# Patient Record
Sex: Male | Born: 1975 | Race: White | Hispanic: No | Marital: Married | Smoking: Former smoker
Health system: Southern US, Community
[De-identification: ages and names within clinical notes are randomized; demographics above are authoritative.]

## PROBLEM LIST (undated history)

## (undated) DIAGNOSIS — J45909 Unspecified asthma, uncomplicated: Secondary | ICD-10-CM

## (undated) DIAGNOSIS — B029 Zoster without complications: Secondary | ICD-10-CM

## (undated) DIAGNOSIS — I8390 Asymptomatic varicose veins of unspecified lower extremity: Secondary | ICD-10-CM

## (undated) DIAGNOSIS — Z9109 Other allergy status, other than to drugs and biological substances: Secondary | ICD-10-CM

## (undated) DIAGNOSIS — F988 Other specified behavioral and emotional disorders with onset usually occurring in childhood and adolescence: Secondary | ICD-10-CM

## (undated) DIAGNOSIS — E785 Hyperlipidemia, unspecified: Secondary | ICD-10-CM

## (undated) DIAGNOSIS — Z87891 Personal history of nicotine dependence: Secondary | ICD-10-CM

## (undated) HISTORY — DX: Unspecified asthma, uncomplicated: J45.909

## (undated) HISTORY — DX: Zoster without complications: B02.9

## (undated) HISTORY — PX: VARICOSE VEIN SURGERY: SHX832

## (undated) HISTORY — DX: Other specified behavioral and emotional disorders with onset usually occurring in childhood and adolescence: F98.8

## (undated) HISTORY — DX: Asymptomatic varicose veins of unspecified lower extremity: I83.90

## (undated) HISTORY — DX: Personal history of nicotine dependence: Z87.891

## (undated) HISTORY — PX: ESOPHAGOGASTRODUODENOSCOPY: SHX1529

## (undated) HISTORY — DX: Hyperlipidemia, unspecified: E78.5

## (undated) HISTORY — DX: Other allergy status, other than to drugs and biological substances: Z91.09

---

## 2006-01-04 ENCOUNTER — Ambulatory Visit: Payer: Self-pay | Admitting: Family Medicine

## 2006-02-01 ENCOUNTER — Encounter: Admission: RE | Admit: 2006-02-01 | Discharge: 2006-02-01 | Payer: Self-pay | Admitting: Family Medicine

## 2006-02-06 ENCOUNTER — Ambulatory Visit: Payer: Self-pay | Admitting: Family Medicine

## 2007-05-15 ENCOUNTER — Encounter: Payer: Self-pay | Admitting: Family Medicine

## 2007-05-15 DIAGNOSIS — J4540 Moderate persistent asthma, uncomplicated: Secondary | ICD-10-CM

## 2007-05-15 DIAGNOSIS — J309 Allergic rhinitis, unspecified: Secondary | ICD-10-CM | POA: Insufficient documentation

## 2007-05-16 ENCOUNTER — Ambulatory Visit: Payer: Self-pay | Admitting: Family Medicine

## 2007-10-26 ENCOUNTER — Ambulatory Visit: Payer: Self-pay | Admitting: Family Medicine

## 2008-01-01 ENCOUNTER — Ambulatory Visit: Payer: Self-pay | Admitting: Family Medicine

## 2008-01-02 ENCOUNTER — Ambulatory Visit: Payer: Self-pay | Admitting: Family Medicine

## 2008-01-04 LAB — CONVERTED CEMR LAB
ALT: 41 units/L (ref 0–53)
AST: 26 units/L (ref 0–37)
Bilirubin, Direct: 0.1 mg/dL (ref 0.0–0.3)
CO2: 29 meq/L (ref 19–32)
Chloride: 103 meq/L (ref 96–112)
Cholesterol: 233 mg/dL (ref 0–200)
Creatinine, Ser: 1.2 mg/dL (ref 0.4–1.5)
Direct LDL: 147.5 mg/dL
GFR calc non Af Amer: 75 mL/min
Potassium: 4 meq/L (ref 3.5–5.1)
Sodium: 138 meq/L (ref 135–145)
Total Bilirubin: 0.9 mg/dL (ref 0.3–1.2)
Total CHOL/HDL Ratio: 6.7
VLDL: 42 mg/dL — ABNORMAL HIGH (ref 0–40)

## 2008-04-01 ENCOUNTER — Ambulatory Visit: Payer: Self-pay | Admitting: Family Medicine

## 2008-04-01 DIAGNOSIS — L989 Disorder of the skin and subcutaneous tissue, unspecified: Secondary | ICD-10-CM | POA: Insufficient documentation

## 2008-04-04 ENCOUNTER — Ambulatory Visit: Payer: Self-pay | Admitting: Family Medicine

## 2008-04-11 LAB — CONVERTED CEMR LAB
Cholesterol: 257 mg/dL (ref 0–200)
HDL: 34.6 mg/dL — ABNORMAL LOW (ref >39.0)
Total CHOL/HDL Ratio: 7.4
VLDL: 45 mg/dL — ABNORMAL HIGH (ref 0–40)

## 2008-05-05 ENCOUNTER — Encounter: Payer: Self-pay | Admitting: Family Medicine

## 2008-05-06 ENCOUNTER — Ambulatory Visit: Payer: Self-pay | Admitting: Family Medicine

## 2008-05-12 LAB — CONVERTED CEMR LAB
BUN: 17 mg/dL (ref 6–23)
Basophils Absolute: 0.2 10*3/uL — ABNORMAL HIGH (ref 0.0–0.1)
Calcium: 9.3 mg/dL (ref 8.4–10.5)
Eosinophils Relative: 9.9 % — ABNORMAL HIGH (ref 0.0–5.0)
GFR calc non Af Amer: 74.8 mL/min (ref >60)
Glucose, Bld: 84 mg/dL (ref 70–99)
Lymphocytes Relative: 34.7 % (ref 12.0–46.0)
Monocytes Relative: 10.8 % (ref 3.0–12.0)
Platelets: 242 10*3/uL (ref 150.0–400.0)
RDW: 12.1 % (ref 11.5–14.6)
Sodium: 141 meq/L (ref 135–145)
TSH: 2 microintl units/mL (ref 0.35–5.50)
WBC: 6 10*3/uL (ref 4.5–10.5)

## 2008-07-06 ENCOUNTER — Emergency Department (HOSPITAL_COMMUNITY): Admission: EM | Admit: 2008-07-06 | Discharge: 2008-07-06 | Payer: Self-pay | Admitting: Emergency Medicine

## 2008-09-11 ENCOUNTER — Ambulatory Visit: Payer: Self-pay | Admitting: Family Medicine

## 2008-12-15 ENCOUNTER — Telehealth: Payer: Self-pay | Admitting: Family Medicine

## 2009-02-24 ENCOUNTER — Ambulatory Visit: Payer: Self-pay | Admitting: Family Medicine

## 2009-07-29 ENCOUNTER — Ambulatory Visit: Payer: Self-pay | Admitting: Family Medicine

## 2009-07-29 DIAGNOSIS — I8390 Asymptomatic varicose veins of unspecified lower extremity: Secondary | ICD-10-CM

## 2009-07-31 ENCOUNTER — Encounter (INDEPENDENT_AMBULATORY_CARE_PROVIDER_SITE_OTHER): Payer: Self-pay | Admitting: *Deleted

## 2009-12-01 ENCOUNTER — Emergency Department (HOSPITAL_COMMUNITY): Admission: EM | Admit: 2009-12-01 | Discharge: 2009-12-01 | Payer: Self-pay | Admitting: Emergency Medicine

## 2009-12-04 ENCOUNTER — Ambulatory Visit: Payer: Self-pay | Admitting: Family Medicine

## 2009-12-04 DIAGNOSIS — J984 Other disorders of lung: Secondary | ICD-10-CM | POA: Insufficient documentation

## 2009-12-11 ENCOUNTER — Ambulatory Visit: Payer: Self-pay | Admitting: Internal Medicine

## 2010-01-20 ENCOUNTER — Telehealth (INDEPENDENT_AMBULATORY_CARE_PROVIDER_SITE_OTHER): Payer: Self-pay | Admitting: *Deleted

## 2010-02-26 ENCOUNTER — Ambulatory Visit
Admission: RE | Admit: 2010-02-26 | Discharge: 2010-02-26 | Payer: Self-pay | Source: Home / Self Care | Attending: Family Medicine | Admitting: Family Medicine

## 2010-02-26 DIAGNOSIS — F988 Other specified behavioral and emotional disorders with onset usually occurring in childhood and adolescence: Secondary | ICD-10-CM | POA: Insufficient documentation
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
## 2010-03-23 NOTE — Assessment & Plan Note (Signed)
Summary: CPX/CLE   Vital Signs:  Patient profile:   35 year old male Height:      75.25 inches Weight:      254.2 pounds BMI:     31.68 Temp:     98.1 degrees F oral Pulse rate:   80 / minute Pulse rhythm:   regular BP sitting:   140 / 80  (left arm) Cuff size:   regular  Vitals Entered By: Benny Lennert CMA Duncan Dull) (July 29, 2009 2:42 PM)  History of Present Illness: Chief complaint cpx  Elevated BP.Marland Kitchena lot of anxiety, family health issues. Intermittant exercises. At home 110-20-80-90  When exercising in last months, pain in calf. Has been doing some stretches.  Has varicose veins... interested in referral for consultation and further eval. HAs had inner thigh vein sclerotherapy treatment in past. Has worn compression hose in past. Minimal swelling at ankles.  Eating a lot of fast food... trying to eat healthier.. now that baby sleeping better 63 monmth old. Able to start eating healthier.    Borderline chol last year...due for recheck  Asthma: well controlled on Advair 1 inh every other day.       Asthma History    Initial Asthma Severity Rating:    Age range: 12+ years    Symptoms: 0-2 days/week    Nighttime Awakenings: 0-2/month    Interferes w/ normal activity: no limitations    SABA use (not for EIB): 0-2 days/week    Asthma Severity Assessment: Intermittent   Problems Prior to Update: 1)  Elevated Blood Pressure Without Diagnosis of Hypertension  (ICD-796.2) 2)  Uri  (ICD-465.9) 3)  Muscle Strain, Abdominal Wall  (ICD-848.8) 4)  Palpitations  (ICD-785.1) 5)  Skin Lesions, Multiple  (ICD-709.9) 6)  Screening For Lipoid Disorders  (ICD-V77.91) 7)  Well Adult Exam  (ICD-V70.0) 8)  Cough  (ICD-786.2) 9)  Tobacco Abuse  (ICD-305.1) 10)  Asthma  (ICD-493.90) 11)  Allergic Rhinitis  (ICD-477.9)  Current Medications (verified): 1)  Advair Diskus 100-50 Mcg/dose Aepb (Fluticasone-Salmeterol) .[name].. 1 Inh Daily  Allergies (verified): No Known Drug  Allergies  Past History:  Past medical, surgical, family and social histories (including risk factors) reviewed, and no changes noted (except as noted below).  Past Medical History: Reviewed history from 05/15/2007 and no changes required. TOBACCO ABUSE (ICD-305.1) ASTHMA (ICD-493.90) ALLERGIC RHINITIS (ICD-477.9)    Past Surgical History: Reviewed history from 05/15/2007 and no changes required. 2007    PFT, mild obstructive disease             Allergy tests:  rabbits, pine             Varicose vein surgery - sclerotherapy  Family History: Reviewed history from 05/15/2007 and no changes required. Father: Died MI age 77, HTN Mother: Alive 17, valve replacement Siblings: 3 brothers, high cholesterol 2 sisters:  1.  Varicose veins                  2.  Hx "broken back" - overdose (med mistake) brother: throat cancer No MI < 55 MGF:  Lung CA  Social History: Reviewed history from 01/01/2008 and no changes required. 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 CV:  Denies chest pain or discomfort. Resp:  Denies shortness  of breath. GI:  Denies abdominal pain. GU:  Denies dysuria.  Physical Exam  General:  Healthy appearing  male in NAD Ears:  External ear exam shows no significant lesions or deformities.  Otoscopic examination reveals clear canals, tympanic membranes are intact bilaterally without bulging, retraction, inflammation or discharge. Hearing is grossly normal bilaterally. Nose:  External nasal examination shows no deformity or inflammation. Nasal mucosa are pink and moist without lesions or exudates. Mouth:  Oral mucosa and oropharynx without lesions or exudates.  Teeth in good  repair. Neck:  No deformities, masses, or tenderness noted. Lungs:  Normal respiratory effort, chest expands symmetrically. Lungs are clear to auscultation, no crackles or wheezes. Heart:  Normal rate and regular rhythm. S1 and S2 normal without gallop, murmur, click, rub or other extra sounds. Abdomen:  Bowel sounds positive,abdomen soft and non-tender without masses, organomegaly or hernias noted. Genitalia:  Testes bilaterally descended without nodularity, tenderness or masses. No scrotal masses or lesions. No penis lesions or urethral discharge. Msk:  No deformity or scoliosis noted of thoracic or lumbar spine.   Pulses:  R and L posterior tibial pulses are full and equal bilaterally  Extremities:  no edema Skin:  Intact without suspicious lesions or rashes Psych:  Cognition and judgment appear intact. Alert and cooperative with normal attention span and concentration. No apparent delusions, illusions, hallucinations   Impression & Recommendations:  Problem # 1:  Preventive Health Care (ICD-V70.0) The patient's preventative maintenance and recommended screening tests for an annual wellness exam were reviewed in full today. Brought up to date unless services declined.  Counselled on the importance of diet, exercise, and its role in overall health and mortality. The patient's FH and SH was reviewed, including their home life, tobacco status, and drug and alcohol status.     Problem # 2:  VARICOSE VEINS, LOWER EXTREMITIES (ICD-454.9) Referral to vascular clinic for eval and treat. Elevate, wear compression hose.  Orders: Vascular Clinic (Vascular)  Problem # 3:  ASTHMA (ICD-493.90) Well controlled. Nml spirometry. Decrease  current medication to 100/50 daily.  His updated medication list for this problem includes:    Advair Diskus 100-50 Mcg/dose Aepb (Fluticasone-salmeterol) .[name]... 1 inh daily  Orders: Spirometry w/Graph (94010)  Problem # 4:  ELEVATED BLOOD PRESSURE WITHOUT  DIAGNOSIS OF HYPERTENSION (ICD-796.2) Encouraged exercise, weight loss, healthy eating habits.  BP today: 140/80 Prior BP: 140/100 (02/24/2009)  Labs Reviewed: Creat: 1.2 (05/06/2008) Chol: 257 (04/04/2008)   HDL: 34.6 (04/04/2008)   LDL: DEL (04/04/2008)   TG: 225 (04/04/2008)  Instructed in low sodium diet (DASH Handout) and behavior modification.    Complete Medication List: 1)  Advair Diskus 100-50 Mcg/dose Aepb (Fluticasone-salmeterol) .[name].. 1 inh daily  Other Orders: Pneumococcal Vaccine (14782) Admin 1st Vaccine (95621) Admin 1st Vaccine [facility]) [phone])  Patient Instructions: 1)  Follow BP call if BP x 3 above 140./90.  2)  Decrease advair to 100/50.:Callif not well controlled.  3)  Fasting lipids, CMET Dx 272.0 4)  Referral Appointment Information 5)  Day/Date: 6)  Time: 7)  Place/MD: 8)  Address: 9)  Phone/Fax: 10)  Patient given appointment information. Information/Orders faxed/mailed.  Prescriptions: ADVAIR DISKUS 100-50 MCG/DOSE AEPB (FLUTICASONE-SALMETEROL) 1 inh daily  #1 x 11   Entered and Authorized by:   Kerby Nora MD   Signed by:   Kerby Nora MD on 07/29/2009   Method used:   Electronically to        [facility]  [address] [phone][facility] (retail)  [address]       [place], [place]  [address]       Ph: [phone] or [phone]       Fax: [phone]   RxID:   [id number]   Current Allergies (reviewed today): No known allergies      Pneumovax Vaccine    Vaccine Type: Pneumovax    Site: right deltoid    Mfr: Merck    Dose: 0.5 ml    Route: Benitez    Given by: Benny Lennert CMA (AAMA)    Exp. Date: 12/16/2010    Lot #: [id number]    VIS given: 09/19/95 version given July 29, 2009.

## 2010-03-23 NOTE — Progress Notes (Signed)
Summary: having trouble focusing   Phone Note Call from Patient Call back at [phone]   Caller: Patient Call For: Kerby Nora MD Summary of Call: Patient has recently started back to school and he is having a very hard time focusing between school, work, and daily life. He is asking if you have any suggestion for any natural suppliment that he could take to try and help him with focusing. He doesnt' really want to have to take medication, so he just wants to try something natural to see if that helps first. Please advise.  Initial call taken by: Melody Comas,  January 20, 2010 11:15 AM  Follow-up for Phone Call        Small amount of caffeine in AMs.[name] no more than 2 cups could help if he does not already use.  Ptherwise can try ginko biloba, but not terribly effective.  Follow-up by: Kerby Nora MD,  January 20, 2010 12:30 PM  Additional Follow-up for Phone Call Additional follow up Details #1::        Left message on machine with instructions as advised by the doctor.Consuello Masse CMA   Additional Follow-up by: Benny Lennert CMA Duncan Dull),  January 20, 2010 1:47 PM

## 2010-03-23 NOTE — Assessment & Plan Note (Signed)
Summary: cough for past week   Vital Signs:  Patient profile:   35 year old male Height:      75.25 inches Weight:      249.4 pounds BMI:     31.08 Temp:     97.9 degrees F oral Pulse rate:   80 / minute Pulse rhythm:   regular BP sitting:   140 / 100  (left arm) Cuff size:   regular  Vitals Entered By: Benny Lennert CMA Duncan Dull) (February 24, 2009 4:32 PM)  History of Present Illness: Chief complaint cough for 1 week   peak flows 750  Acute Visit History:      The patient complains of cough, earache, nasal discharge, and sinus problems.  These symptoms began 2 weeks ago.  He denies fever.  Other comments include: Improved some in last week Presure behind right eye.  Icc chest tightness at night. Advil Cold and sinus. Not needed albuterol.        The character of the cough is described as nonproductive.  There is no history of wheezing, sleep interference, or shortness of breath associated with his cough.        The earache is located on the right side.        He complains of sinus pressure and frontal headache.        Problems Prior to Update: 1)  Muscle Strain, Abdominal Wall  (ICD-848.8) 2)  Palpitations  (ICD-785.1) 3)  Skin Lesions, Multiple  (ICD-709.9) 4)  Screening For Lipoid Disorders  (ICD-V77.91) 5)  Well Adult Exam  (ICD-V70.0) 6)  Cough  (ICD-786.2) 7)  Tobacco Abuse  (ICD-305.1) 8)  Asthma  (ICD-493.90) 9)  Allergic Rhinitis  (ICD-477.9)  Current Medications (verified): 1)  Advair Diskus 250-50 Mcg/dose  Misc (Fluticasone-Salmeterol) .... Use 1 Inhalation Once Daily  As Needed  Allergies (verified): No Known Drug Allergies  Past History:  Past medical, surgical, family and social histories (including risk factors) reviewed, and no changes noted (except as noted below).  Past Medical History: Reviewed history from 05/15/2007 and no changes required. TOBACCO ABUSE (ICD-305.1) ASTHMA (ICD-493.90) ALLERGIC RHINITIS (ICD-477.9)    Past Surgical  History: Reviewed history from 05/15/2007 and no changes required. 2007    PFT, mild obstructive disease             Allergy tests:  rabbits, pine             Varicose vein surgery - sclerotherapy  Family History: Reviewed history from 05/15/2007 and no changes required. Father: Died MI age 47, HTN Mother: Alive 64, valve replacement Siblings: 3 brothers, high cholesterol 2 sisters:  1.  Varicose veins                  2.  Hx "broken back" - overdose (med mistake) No MI < 55 MGF:  Lung CA  Social History: Reviewed history from 01/01/2008 and no changes required. 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 CV:  Denies chest pain or discomfort and swelling of feet. Resp:  Denies shortness of breath. GI:  Denies abdominal pain. GU:  Denies dysuria.  Physical Exam  General:  Healthy appearing muscular male in NAD Head:  no maxillary sinus ttp Ears:  External ear exam shows no  significant lesions or deformities.  Otoscopic examination reveals clear canals, tympanic membranes are intact bilaterally without bulging, retraction, inflammation or discharge. Hearing is grossly normal bilaterally. Nose:  nasal discharge and mucosal pallor.   Mouth:  Oral mucosa and oropharynx without lesions or exudates.  Teeth in good repair. MMM Neck:  no carotid bruit or thyromegaly no cervical or supraclavicular lymphadenopathy  Lungs:  Normal respiratory effort, chest expands symmetrically. Lungs are clear to auscultation, no crackles or wheezes. Heart:  Normal rate and regular rhythm. S1 and S2 normal without gallop, murmur, click, rub or other extra sounds.   Impression & Recommendations:  Problem # 1:  URI  (ICD-465.9) No ongoing asthma response..Peak flows great. No need for prednisone. Treat symptomatically.   Problem # 2:  ALLERGIC RHINITIS (ICD-477.9)  Continue current meds.  Discussed use of allergy medications and environmental measures.   Problem # 3:  ELEVATED BLOOD PRESSURE WITHOUT DIAGNOSIS OF HYPERTENSION (ICD-796.2) ? due to URI, stress, rushing here. Will follow at home. Call if >/ 140/90 consistently. Encouraged exercise, weight loss, healthy eating habits.   Complete Medication List: 1)  Advair Diskus 250-50 Mcg/dose Misc (Fluticasone-salmeterol) .... Use 1 inhalation once daily  as needed  Patient Instructions: 1)  Call if BP greater than 140/90 consistently. 2)  Restart exercise, decrease salt in diet and work on weight loss. 3)  Please schedule a follow-up appointment in 1 month elevated BPs.   Current Allergies (reviewed today): No known allergies

## 2010-03-23 NOTE — Letter (Signed)
Summary: [place] No Show Letter  [place] [facility]  [address] [place], [place] [address]   Phone: [phone]  Fax: [phone]    07/31/2009 MRN: [id number]  Aaron Tyler [address] [place], [place]  [address]   Dear Mr. Bradt,   Our records indicate that you missed your scheduled appointment with ___lab__________________ on __6.10.11__________.  Please contact this office to reschedule your appointment as soon as possible.  It is important that you keep your scheduled appointments with your physician, so we can provide you the best care possible.  Please be advised that there may be a charge for "no show" appointments.    Sincerely,   [place] [facility]

## 2010-03-25 NOTE — Assessment & Plan Note (Signed)
Summary: DISCUSS ADD/CLE   Vital Signs:  Patient profile:   35 year old male Height:      75.25 inches Weight:      253 pounds BMI:     31.53 Temp:     97.7 degrees F oral Pulse rate:   80 / minute Pulse rhythm:   regular BP sitting:   120 / 90  (left arm) Cuff size:   regular  Vitals Entered By: Benny Lennert CMA Duncan Dull) (February 26, 2010 4:11 PM)  History of Present Illness: Chief complaint discuss add  Difficulty with concentration and focus at work, school for [employer] as well as at home. Diffculty completing tasks. Feels scatterbrained. Continues to have to review same lines reading over and over again.  He was diagnosed as a child with ADHD in [place] around age 74...never was treated.  Has some friends that have ADD who have similar symptoms.  Has been taking eleviiv and ginko biloba.  No depression, mild social anxiety, no mania. No moodiness [place]  High stress job. No SI/no HI.   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 1 Inh Daily 2)  Adderall Xr 20 Mg Xr24h-Cap (Amphetamine-Dextroamphetamine) .... Take 1 Tablet By Mouth Once A Day in Am  Allergies (verified): No Known Drug Allergies  Past History:  Past medical, surgical, family and social histories (including risk factors) reviewed, and no changes noted (except as noted below).  Past Medical History: Reviewed history from 05/15/2007 and no changes  required. TOBACCO ABUSE (ICD-305.1) ASTHMA (ICD-493.90) ALLERGIC RHINITIS (ICD-477.9)    Past Surgical History: Reviewed history from 05/15/2007 and no changes required. 2007    PFT, mild obstructive disease             Allergy tests:  rabbits, pine             Varicose vein surgery - sclerotherapy  Family History: Reviewed history from 07/29/2009 and no changes required. Father: Died MI age 64, HTN Mother: Alive 67, valve replacement Siblings: 3 brothers, high cholesterol 2 sisters:  1.  Varicose veins                  2.  Hx "broken back" - overdose (med mistake) brother: throat cancer No MI < 55 MGF:  Lung CA  Social History: Reviewed history from 01/01/2008 and no changes required. 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 CV:  Denies chest pain or discomfort. Resp:  Denies shortness of breath.  Physical Exam  General:  Well-developed,well-nourished,in no acute distress; alert,appropriate and cooperative throughout examination Mouth:  Oral mucosa and oropharynx without lesions or exudates.  Teeth in good repair. Neck:  no carotid bruit or thyromegaly no cervical or supraclavicular lymphadenopathy  Lungs:  Normal respiratory effort, chest expands  symmetrically. Lungs are clear to auscultation, no crackles or wheezes. Heart:  Normal rate and regular rhythm. S1 and S2 normal without gallop, murmur, click, rub or other extra sounds. Pulses:  R and L posterior tibial pulses are full and equal bilaterally    Impression & Recommendations:  Problem # 1:  ADD (ICD-314.00) Diagnosis as child. Meets qualifications of adult ADD. Discussed diagnosis and treatment options. Start adderall XR  20 mg daily. Close follow up in 1 month.   Complete Medication List: 1)  Advair Diskus 100-50 Mcg/dose Aepb (Fluticasone-salmeterol) .[name].. 1 inh daily 2)  Adderall Xr 20 Mg Xr24h-cap (Amphetamine-dextroamphetamine) .... Take 1 tablet by mouth once a day in am  Patient Instructions: 1)  Follow up ADD in 1 month.  Prescriptions: ADDERALL XR 20 MG XR24H-CAP (AMPHETAMINE-DEXTROAMPHETAMINE) Take 1 tablet by mouth once a day in AM  #30 x 0   Entered and Authorized by:   Kerby Nora MD   Signed by:   Kerby Nora MD on 02/26/2010   Method used:   Print then Give to Patient   RxID:   [id number] ADDERALL XR 10 MG XR24H-CAP (AMPHETAMINE-DEXTROAMPHETAMINE) 1 tab by mouth q AM  #30 x 0   Entered and Authorized by:   Kerby Nora MD   Signed by:   Kerby Nora MD on 02/26/2010   Method used:   Print then Give to Patient   RxID:   [id number]    Orders Added: 1)  Est. Patient Level III [42595]    Current Allergies (reviewed today): No known allergies

## 2010-03-26 ENCOUNTER — Ambulatory Visit: Payer: Self-pay | Admitting: Family Medicine

## 2010-03-26 ENCOUNTER — Ambulatory Visit: Admit: 2010-03-26 | Payer: Self-pay | Admitting: Family Medicine

## 2010-03-26 DIAGNOSIS — Z0289 Encounter for other administrative examinations: Secondary | ICD-10-CM

## 2010-04-05 ENCOUNTER — Encounter: Payer: Self-pay | Admitting: Family Medicine

## 2010-04-05 ENCOUNTER — Ambulatory Visit (INDEPENDENT_AMBULATORY_CARE_PROVIDER_SITE_OTHER): Payer: BC Managed Care – PPO | Admitting: Family Medicine

## 2010-04-05 DIAGNOSIS — F988 Other specified behavioral and emotional disorders with onset usually occurring in childhood and adolescence: Secondary | ICD-10-CM

## 2010-04-14 NOTE — Assessment & Plan Note (Signed)
Summary: REFILL MEDICATION/CLE   Vital Signs:  Patient profile:   35 year old male Weight:      251.75 pounds Temp:     98.2 degrees F oral Pulse rate:   72 / minute Pulse rhythm:   regular BP sitting:   110 / 78  (left arm) Cuff size:   large  Vitals Entered By: Sydell Axon LPN (April 05, 2010 8:36 AM) CC: Refill medication   History of Present Illness: ADD: Started on adderal 20 mg daily 1 month ago for symtpoms orf poor concentration at home, school  and work.  He had been previously Dx as a child.  Since starting medicaiton he reports  improved concentrarion at school and work. Getting 100 % at school. Improved retention. Still sometmes he gets off track occasionally.. he thinks that concentration could still be somewhat better. Wearing off around 5-5:30... taking at 7 AM.  80% improvement in symptoms.   No heart racing, no weight loss, no stomach upset... mild loose stools.  No trouble sleeping at night.  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 1 Inh Daily 2)  Adderall Xr 20 Mg Xr24h-Cap (Amphetamine-Dextroamphetamine) .... Take 1 Tablet By Mouth Once A Day in Am  Allergies (verified): No Known Drug Allergies  Past History:  Past medical, surgical, family and social histories (including risk factors) reviewed, and no changes noted (except as noted below).  Past Medical History: Reviewed history from 05/15/2007 and no changes  required. TOBACCO ABUSE (ICD-305.1) ASTHMA (ICD-493.90) ALLERGIC RHINITIS (ICD-477.9)    Past Surgical History: Reviewed history from 05/15/2007 and no changes required. 2007    PFT, mild obstructive disease             Allergy tests:  rabbits, pine             Varicose vein surgery - sclerotherapy  Family History: Reviewed history from 07/29/2009 and no changes required. Father: Died MI age 54, HTN Mother: Alive 29, valve replacement Siblings: 3 brothers, high cholesterol 2 sisters:  1.  Varicose veins                  2.  Hx "broken back" - overdose (med mistake) brother: throat cancer No MI < 55 MGF:  Lung CA  Social History: Reviewed history from 01/01/2008 and no changes required. 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 CV:  Denies chest pain or discomfort. Resp:  Denies shortness of breath.  Physical Exam  General:  Well-developed,well-nourished,in no acute distress; alert,appropriate and cooperative throughout examination Mouth:  Oral mucosa and oropharynx without lesions or exudates.  Teeth in good repair. Neck:  no carotid bruit or thyromegaly no cervical or supraclavicular lymphadenopathy  Lungs:  Normal respiratory effort, chest expands symmetrically. Lungs are clear to  auscultation, no crackles or wheezes. Heart:  Normal rate and regular rhythm. S1 and S2 normal without gallop, murmur, click, rub or other extra sounds. Pulses:  R and L posterior tibial pulses are full and equal bilaterally  Extremities:  no edema   Impression & Recommendations:  Problem # 1:  ADD (ICD-314.00) 80% improvement.. but could improve more... Will increase to 25 mg daily...can always consider  longer acting mediction if needed... wearing off around at 10 hours. No SE at current dose.   Complete Medication List: 1)  Advair Diskus 100-50 Mcg/dose Aepb (Fluticasone-salmeterol) .[name].. 1 inh daily 2)  Adderall Xr 25 Mg Xr24h-cap (Amphetamine-dextroamphetamine) .[name].. 1 tab by mouth daily  Patient Instructions: 1)  Please schedule a follow-up appointment in 1 month. Prescriptions: ADDERALL XR 25 MG XR24H-CAP (AMPHETAMINE-DEXTROAMPHETAMINE) 1 tab by mouth daily  #30 x 0   Entered and Authorized by:   Kerby Nora MD   Signed by:   Kerby Nora MD on 04/05/2010   Method used:   Print then Give to Patient   RxID:   [id number]    Orders Added: 1)  Est. Patient Level III [14782]    Current Allergies (reviewed today): No known allergies

## 2010-05-03 ENCOUNTER — Encounter: Payer: Self-pay | Admitting: Family Medicine

## 2010-05-03 ENCOUNTER — Ambulatory Visit (INDEPENDENT_AMBULATORY_CARE_PROVIDER_SITE_OTHER): Payer: BC Managed Care – PPO | Admitting: Family Medicine

## 2010-05-03 DIAGNOSIS — M67919 Unspecified disorder of synovium and tendon, unspecified shoulder: Secondary | ICD-10-CM | POA: Insufficient documentation

## 2010-05-03 DIAGNOSIS — J45909 Unspecified asthma, uncomplicated: Secondary | ICD-10-CM

## 2010-05-03 DIAGNOSIS — F988 Other specified behavioral and emotional disorders with onset usually occurring in childhood and adolescence: Secondary | ICD-10-CM

## 2010-05-03 DIAGNOSIS — J309 Allergic rhinitis, unspecified: Secondary | ICD-10-CM

## 2010-05-03 DIAGNOSIS — M719 Bursopathy, unspecified: Secondary | ICD-10-CM

## 2010-05-03 DIAGNOSIS — J019 Acute sinusitis, unspecified: Secondary | ICD-10-CM | POA: Insufficient documentation

## 2010-05-06 LAB — POCT I-STAT, CHEM 8
BUN: 16 mg/dL (ref 6–23)
Chloride: 108 mEq/L (ref 96–112)
Potassium: 4 mEq/L (ref 3.5–5.1)
Sodium: 136 mEq/L (ref 135–145)
TCO2: 22 mmol/L (ref 0–100)

## 2010-05-06 LAB — DIFFERENTIAL
Basophils Relative: 0 % (ref 0–1)
Lymphocytes Relative: 32 % (ref 12–46)
Lymphs Abs: 1.9 10*3/uL (ref 0.7–4.0)
Monocytes Relative: 9 % (ref 3–12)
Neutro Abs: 3.1 10*3/uL (ref 1.7–7.7)
Neutrophils Relative %: 51 % (ref 43–77)

## 2010-05-06 LAB — SAMPLE TO BLOOD BANK

## 2010-05-06 LAB — CBC
Hemoglobin: 15.5 g/dL (ref 13.0–17.0)
RBC: 4.91 MIL/uL (ref 4.22–5.81)
WBC: 6 10*3/uL (ref 4.0–10.5)

## 2010-05-06 LAB — URINALYSIS, ROUTINE W REFLEX MICROSCOPIC
Glucose, UA: NEGATIVE mg/dL
Protein, ur: NEGATIVE mg/dL

## 2010-05-07 ENCOUNTER — Ambulatory Visit: Payer: BC Managed Care – PPO | Admitting: Family Medicine

## 2010-05-11 NOTE — Assessment & Plan Note (Signed)
Summary: CONGESTION,COUGH/CLE   [employer]   Vital Signs:  Patient profile:   35 year old male Height:      75.25 inches Weight:      246 pounds BMI:     30.65 Temp:     98.6 degrees F oral Pulse rate:   72 / minute Pulse rhythm:   regular BP sitting:   120 / 70  (left arm) Cuff size:   large  Vitals Entered By: Benny Lennert CMA Duncan Dull) (May 03, 2010 3:54 PM)  History of Present Illness: Chief complaint ? sinus infection and cough  Pain in right posterior shoulder.. since lifting weights/ going to gym. No specific known injury.  Uncomfortable at night.  ADD.[name] stable on adderall XR 25 mg daily.  Imprved symptoms on higher dose. He would like to continue at this dose. No new SE.. personality is more intense.   Has been losing weight but has been working on diet and exercise.   Acute Visit History:      The patient complains of cough, earache, nasal discharge, sinus problems, and sore throat.  These symptoms began 1 week ago.  He denies fever.  Other comments include: Fatigue.   Using advil PM, robitussin.   Using netty pot 1-2 times a week.  Using Advair two times a day.        The patient notes wheezing.  The character of the cough is described as nonproductive.        He complains of sinus pressure, teeth aching, ears being blocked, nasal congestion, and purulent drainage.        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 1 Inh Daily 2)  Adderall  Xr 25 Mg Xr24h-Cap (Amphetamine-Dextroamphetamine) .[name].. 1 Tab By Mouth Daily 3)  Proair Hfa 108 (90 Base) Mcg/act Aers (Albuterol Sulfate) .... 2 Puffs Inh Every 4-6 Hour As Needed Wheeze 4)  Amoxicillin 500 Mg Caps (Amoxicillin) .... 2 Tab By Mouth Two Times A Day X 10 Days 5)  Cheratussin Ac 100-10 Mg/18ml Syrp (Guaifenesin-Codeine) .[name].. 1-2 Tsp By Mouth At Bedtime As Needed Cough 6)  Diclofenac Sodium 75 Mg Tbec (Diclofenac Sodium) .[name].. 1 Tab By Mouth Two Times A Day As Needed Shoulder Pain.  Allergies (verified): No Known Drug Allergies  Past History:  Past medical, surgical, family and social histories (including risk factors) reviewed, and no changes noted (except as noted below).  Past Medical History: Reviewed history from 05/15/2007 and no changes required. TOBACCO ABUSE (ICD-305.1) ASTHMA (ICD-493.90) ALLERGIC RHINITIS (ICD-477.9)    Past Surgical History: Reviewed history from 05/15/2007 and no changes required. 2007    PFT, mild obstructive disease             Allergy tests:  rabbits, pine             Varicose vein surgery - sclerotherapy  Family History: Reviewed history from 07/29/2009 and no changes required. Father: Died MI age 15, HTN Mother: Alive 69, valve replacement Siblings: 3 brothers, high cholesterol  2 sisters:  1.  Varicose veins                  2.  Hx "broken back" - overdose (med mistake) brother: throat cancer No MI < 55 MGF:  Lung CA  Social History: Reviewed history from 01/01/2008 and no changes required. 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 CV:  Denies chest pain or discomfort and palpitations. Resp:  Denies shortness of breath. GI:  Denies  abdominal pain. GU:  Denies dysuria.  Physical Exam  General:  Well-developed,well-nourished,in no acute distress; alert,appropriate and cooperative throughout examination Head:  B maxiallary sinus ttp  Ears:  External ear exam shows no significant lesions or deformities.  Otoscopic examination reveals clear canals, tympanic membranes are intact bilaterally without bulging, retraction, inflammation or discharge. Hearing is grossly normal bilaterally. Nose:  nasal dischargemucosal pallor.   Mouth:  Oral mucosa and oropharynx without lesions or exudates.  Teeth in good repair. Neck:  no carotid bruit or thyromegaly no cervical or supraclavicular lymphadenopathy  Lungs:  Normal respiratory effort, chest expands symmetrically. Lungs are clear to auscultation, no crackles or wheezes. Heart:  Normal rate and regular rhythm. S1 and S2 normal without gallop, murmur, click, rub or other extra sounds. Msk:  ttp right  posterior subacromial ttp, pos right impingement sign, neg drop arm test Pain with int rotation, and abduction No cervical vertebral ttp, full ROM of neck Pulses:  R and L posterior tibial pulses are full and equal bilaterally  Extremities:  no edema Neurologic:  No cranial nerve deficits noted. Station and gait are normal. Plantar reflexes are down-going bilaterally. DTRs are symmetrical throughout. Sensory, motor and coordinative functions appear intact. Psych:  Cognition and judgment appear intact. Alert and cooperative with normal attention span and concentration. No apparent delusions, illusions, hallucinations   Impression & Recommendations:  Problem # 1:  SINUSITIS, ACUTE (ICD-461.9)  Start antibiotic x 10 days. Treat with cough suppressant, mucinex during the day and nasal saline irrigation.  Instructed on treatment. Call if symptoms persist or worsen.   His updated medication list for this problem includes:    Amoxicillin 500 Mg Caps (Amoxicillin) .[name]... 2 tab by mouth two  times a day x 10 days    Cheratussin Ac 100-10 Mg/34ml Syrp (Guaifenesin-codeine) .[name]... 1-2 tsp by mouth at bedtime as needed cough  Problem # 2:  ASTHMA (ICD-493.90) Stable... no current exacerbation His updated medication list for this problem includes:    Advair Diskus 100-50 Mcg/dose Aepb (Fluticasone-salmeterol) .[name]... 1 inh daily    Proair Hfa 108 (90 Base) Mcg/act Aers (Albuterol sulfate) .[name]... 2 puffs inh every 4-6 hour as needed wheeze  Problem # 3:  ALLERGIC RHINITIS (ICD-477.9)  Discussed use of allergy medications and environmental measures.   Problem # 4:  ROTATOR CUFF SYNDROME, RIGHT (ICD-726.10) Info given on stretching, Start diclofenac 75 mg two times a day . Heat. Follow up  in 2 weekswith Dr. Patsy Lager for possible steroid injection if not improving.   Problem # 5:  ADD (ICD-314.00) Improived on higher dose of adderal...minimal SE. Follow up in 3 months.   Complete Medication List: 1)  Advair Diskus 100-50 Mcg/dose Aepb (Fluticasone-salmeterol) .[name].. 1 inh daily 2)  Adderall Xr 25 Mg Xr24h-cap (Amphetamine-dextroamphetamine) .Marland KitchenMarland KitchenMarland Kitchen 1  tab by mouth daily 3)  Proair Hfa 108 (90 Base) Mcg/act Aers (Albuterol sulfate) .... 2 puffs inh every 4-6 hour as needed wheeze 4)  Amoxicillin 500 Mg Caps (Amoxicillin) .... 2 tab by mouth two times a day x 10 days 5)  Cheratussin Ac 100-10 Mg/9ml Syrp (Guaifenesin-codeine) .[name].. 1-2 tsp by mouth at bedtime as needed cough 6)  Diclofenac Sodium 75 Mg Tbec (Diclofenac sodium) .[name].. 1 tab by mouth two times a day as needed shoulder pain.   Patient Instructions: 1)  Start antibiotic x 10 days. Treat with cough suppressant, mucinex during the day and nasal saline irrigation.  2)  Call if symptoms persist or worsen.  3)   Start shoulder exercises, heat and diclofenac two times a day for pain. 4)   If not improving in 2 weeks make appt with Dr. Patsy Lager [facility] to be evaluated for steroid injection.  5)   Continue current dose of adderal. 6)    Follow up ADD appt in 3 months. Prescriptions: ADDERALL XR 25 MG XR24H-CAP (AMPHETAMINE-DEXTROAMPHETAMINE) 1 tab by mouth daily  #30 x 0   Entered and Authorized by:   Kerby Nora MD   Signed by:   Kerby Nora MD on 05/03/2010   Method used:   Print then Give to Patient   RxID:   [id number] DICLOFENAC SODIUM 75 MG TBEC (DICLOFENAC SODIUM) 1 tab by mouth two times a day as needed shoulder pain.  #30 x 0   Entered and Authorized by:   Kerby Nora MD   Signed by:   Kerby Nora MD on 05/03/2010   Method used:   Print then Give to Patient   RxID:   [id number] CHERATUSSIN AC 100-10 MG/5ML SYRP (GUAIFENESIN-CODEINE) 1-2 tsp by mouth at bedtime as needed cough  #8 oz x 0   Entered and Authorized by:   Kerby Nora MD   Signed by:   Kerby Nora MD on 05/03/2010   Method used:   Print then Give to Patient   RxID:   [id number] AMOXICILLIN 500 MG CAPS (AMOXICILLIN) 2 tab by mouth two times a day x 10 days  #40 x 0   Entered and Authorized by:   Kerby Nora MD   Signed by:   Kerby Nora MD on 05/03/2010   Method used:   Print then Give to Patient   RxID:   [id number] PROAIR HFA 108 (90 BASE) MCG/ACT AERS (ALBUTEROL SULFATE) 2 puffs inh every 4-6 hour as needed wheeze  #1 x 0   Entered and Authorized by:   Kerby Nora MD   Signed by:   Kerby Nora MD on 05/03/2010   Method used:   Print then Give to Patient   RxID:   [id number]    Orders Added: 1)  Est. Patient Level IV [60109]    Current Allergies (reviewed today): No known allergies

## 2010-06-10 ENCOUNTER — Other Ambulatory Visit: Payer: Self-pay | Admitting: *Deleted

## 2010-06-10 NOTE — Telephone Encounter (Signed)
Will write rx upon my return on Friday.

## 2010-06-11 MED ORDER — AMPHETAMINE-DEXTROAMPHET ER 25 MG PO CP24: ORAL_CAPSULE | ORAL | Status: DC

## 2010-07-09 NOTE — Assessment & Plan Note (Signed)
[facility]                             [facility] OFFICE NOTE   Aaron, Tyler                          MRN:          [id number]  DATE:01/04/2006                            DOB:          05-09-1975    CHIEF COMPLAINT:  The patient is a 35 year old white male, here to establish  new doctor.   HISTORY OF PRESENT ILLNESS:  [name] moved here from [place] with his wife  approximately nine months ago.  He states that his major concern at this  point in time is his asthma and allergies.  He had previously been seeing  Dr. Lorie Phenix in [place] and was receiving allergy shots.  He had had allergy  testing that showed he was very allergic to animals.  Over the past four  months, though, he has been using his ProAir Rescue more and more  frequently.  He does not find that he needs it very frequently during the  day, but approximately one time, but then increases usage at night, when he  wakes up with some chest tightness.  The maximum he has been using is five  times per day.  He is going through one ProAir inhaler per month over the  past four months.  He did restart his Allegra and started feeling much  better for about two weeks, but then the symptoms recurred.  He has  hypoallergenic pillows, a new mattress, and tries to avoid allergens, if  possible.  There are no animals in the house.  He is interested in seeing an  allergist to continue allergy shots.  He is on QVAR two puffs twice daily,  unknown dose, as well as what sounds like Advair one puff twice daily,  unknown dose.   In addition, he had been seeing a varicose vein specialist for varicosities  on his bilateral thighs and calves.  He had received sclerotherapy on his  right leg and was due for sclerotherapy on the left.  He would like to be  set up with a varicose vein surgeon in the area, if possible.   REVIEW OF SYSTEMS:  Otherwise negative.   PAST MEDICAL HISTORY:  1. Allergic  rhinitis.  2. Asthma, moderate, persistent.  3. Tobacco abuse.   HOSPITALIZATIONS SURGERIES AND PROCEDURES:  1. In 2007, pulmonary function tests.  2. Allergy tests, showing allergy to many things, including rabbits and      pine.  3. Varicose vein sclerotherapy.  4. Tetanus, 2005.  5. Cholesterol, 2005, per patient report, within normal limits.   FAMILY HISTORY:  Father deceased at age 16 with heart attack and high blood  pressure.  Mother alive, age 85, status post valve replacement.  He has  three brothers, all with elevated cholesterol.  He has two sisters, one with  varicose veins and a second one who is deceased after having a medication  overdose for her broken back.  There is no family history of heart attack  before age 64.  There is no family history of diabetes or any other type of  cancer, other than  lung cancer in the maternal grandfather.   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  In this way,  he is exposed to different things, depending on the household.  He has been  married for three years.  He does not have any kids.  He used to exercise  five times a week, but has dropped down to one to two times a week, but  would like to increase it again.  He tries to get a healthy diet, but it has  been increasingly poor over the past few months.  He eats at [employer]  and [employer] while on the road.  He occasionally skips breakfast.  He does drink water.  He uses tobacco socially with 10-15 cigarettes per  week.  He has light alcohol use, but has cut this back to almost none.  He  does not use any other drugs.   PHYSICAL EXAMINATION:  VITAL SIGNS:  Height 75.25 inches, weight 236.4,  making BMI 28.  Blood pressure 109/96, pulse 88, temperature 97.9.  GENERAL:  Slightly overweight male, in no apparent distress.  HEENT:  PERRLA.  Extraocular muscles intact.   Oropharynx clear.  Tympanic  membranes clear.  Nares clear.  No thyromegaly, no lymphadenopathy, cervical  or cervical.  CARDIOVASCULAR:  Regular rate and rhythm, no murmurs, rubs or gallops.  Normal PMI, 2+ peripheral pulses, no peripheral edema.  LUNGS:  Occasional inspiratory and expiratory wheeze.  No rales or rhonchi.  No cyanosis.  No increased work of breathing.  ABDOMEN:  Soft, nontender, normoactive bowel sounds, no hepatosplenomegaly.  MUSCULOSKELETAL:  Strength 5/5 in upper and lower extremities.  EXTREMITIES:  Bilateral varicosities on calves and thighs, tortuous.  NEUROLOGIC:  Alert and oriented times three.  Cranial nerves II through XII  grossly intact.   ASSESSMENT AND PLAN:  1. Asthma, moderate to severe, persistent:  It sounds as though he is on      both QVAR and Advair.  He is over-using his Advair inhaler.  We      discussed proper times to use this and it does seem he is using it for      the correct indication.  He has likely built up a tolerance to ProAir      at this time.  We do need him to call and let us know the doses of his      current controller medications.  I will likely need to increase them.      I will refer him to an allergist to continue allergy shots.  I will      obtain records from his previous allergist, Dr. Lorie Phenix.  2. Varicose veins:  He will be referred to a varicose vein surgeon for      further sclerotherapy p.r.n.  3. Prevention:  His family does have a strong history of heart attack and      cholesterol.  His last cholesterol was checked in 2005 and I will try      to get this from his previous doctor.  He very likely will need a      recheck of this within the next few years.  He was encouraged to      increase exercise as tolerated and eat a more healthful diet on the      road.     Kerby Nora, MD  AB/MedQ  DD: 01/04/2006  DT: 01/04/2006  Job #: [id number]

## 2010-07-23 ENCOUNTER — Other Ambulatory Visit: Payer: Self-pay | Admitting: *Deleted

## 2010-07-23 MED ORDER — AMPHETAMINE-DEXTROAMPHET ER 25 MG PO CP24: ORAL_CAPSULE | ORAL | Status: DC

## 2010-07-23 NOTE — Telephone Encounter (Signed)
Please call pt when ready.

## 2010-08-05 NOTE — Telephone Encounter (Signed)
Patient notified rx ready for pick up

## 2010-08-10 ENCOUNTER — Other Ambulatory Visit: Payer: Self-pay | Admitting: Family Medicine

## 2010-08-11 ENCOUNTER — Encounter: Payer: Self-pay | Admitting: Family Medicine

## 2010-08-11 ENCOUNTER — Other Ambulatory Visit: Payer: Self-pay | Admitting: *Deleted

## 2010-08-11 NOTE — Telephone Encounter (Signed)
Please call pt when ready.  Dr. Daphine Deutscher patient, he will run out on Saturday.  Has appt on 7/3.

## 2010-08-12 NOTE — Telephone Encounter (Signed)
Please ask Dr. Patsy Lager to refill/print this for this pt as he will be out before my return.  #[phone], [phone]

## 2010-08-13 MED ORDER — AMPHETAMINE-DEXTROAMPHET ER 25 MG PO CP24: ORAL_CAPSULE | ORAL | Status: DC

## 2010-08-13 NOTE — Telephone Encounter (Signed)
Dr. Patsy Lager not in office today will route to dr g and see if he will do

## 2010-08-13 NOTE — Telephone Encounter (Signed)
Filled and placed in kim[facility] box. 

## 2010-08-13 NOTE — Telephone Encounter (Signed)
Patient notified and Rx placed up front for pick up. 

## 2010-08-16 ENCOUNTER — Other Ambulatory Visit: Payer: Self-pay | Admitting: *Deleted

## 2010-08-16 ENCOUNTER — Ambulatory Visit: Payer: BC Managed Care – PPO | Admitting: Family Medicine

## 2010-08-16 MED ORDER — FLUTICASONE-SALMETEROL 100-50 MCG/DOSE IN AEPB: 1.0000 | INHALATION_SPRAY | RESPIRATORY_TRACT | Status: DC

## 2010-08-18 ENCOUNTER — Telehealth: Payer: Self-pay | Admitting: *Deleted

## 2010-08-18 NOTE — Telephone Encounter (Signed)
Need clarification on the advair rx. Fax is on your desk.

## 2010-08-18 NOTE — Telephone Encounter (Signed)
Received fax from [facility] for clarification on

## 2010-08-24 ENCOUNTER — Encounter: Payer: Self-pay | Admitting: Family Medicine

## 2010-08-24 ENCOUNTER — Ambulatory Visit (INDEPENDENT_AMBULATORY_CARE_PROVIDER_SITE_OTHER): Payer: BC Managed Care – PPO | Admitting: Family Medicine

## 2010-08-24 DIAGNOSIS — Z13220 Encounter for screening for lipoid disorders: Secondary | ICD-10-CM

## 2010-08-24 DIAGNOSIS — F988 Other specified behavioral and emotional disorders with onset usually occurring in childhood and adolescence: Secondary | ICD-10-CM

## 2010-08-24 DIAGNOSIS — J984 Other disorders of lung: Secondary | ICD-10-CM

## 2010-08-24 DIAGNOSIS — R030 Elevated blood-pressure reading, without diagnosis of hypertension: Secondary | ICD-10-CM

## 2010-08-24 DIAGNOSIS — I8390 Asymptomatic varicose veins of unspecified lower extremity: Secondary | ICD-10-CM

## 2010-08-24 NOTE — Assessment & Plan Note (Signed)
Due for CT scan reevaluation.

## 2010-08-24 NOTE — Assessment & Plan Note (Signed)
Refer to vascular specialist for referrral.

## 2010-08-24 NOTE — Assessment & Plan Note (Signed)
Improved after resting in office. Continue to follow at home. Call if greater than 140/90 on ADD medicaiton. Encouraged exercise, weight loss, healthy eating habits.

## 2010-08-24 NOTE — Assessment & Plan Note (Signed)
Well controlled on current med, no SE except possible BP elevation and pulse elevation but this was more likely due to rushing here and being in heat today in suit. Follow at home.

## 2010-08-24 NOTE — Patient Instructions (Addendum)
Follow BP  And pulse at home. Goal BP <140/90 and goal pulse 60-90. Call if repeatedly out of this range. Follow up ADD and CPX  in 6 months.  Stop at front office to set up referral to vein center or varicose vein.

## 2010-08-24 NOTE — Progress Notes (Signed)
  Subjective:    Patient ID: Aaron Tyler, male    DOB: 1975-08-07, 35 y.o.   MRN: [id number]  HPI ADD Compliant with meds:adderal Xr 25 mg daily benefit from med (ie increase in concentration): Great improvement... Focused even through end of the day. change in mood:No depression, no anxiety. change in appetite: None. HAs been exercising more working on weightloss. Insomnia:None tremor:None compliant with behavioral modification: Yes. BP slightly increased today as well as pulse but pt late and rushed here from work, also was outside in a suit for a hour previously.. Brought back immediately upon arrival. BP at home controlled, on recheck 124/80   He has started having problems with his varicose veins again.  Pain in veines in calf with standing. S/P past sclerotherapy.Marland Kitchenat vascular center... Not sure where in past.  He request referral to vascular surgeon for treatment.  PMH and SH reviewed  ROS: See HPI.  Otherwise noncontributory.   Meds, vitals, and allergies reviewed.     Review of Systems     Objective:   Physical Exam  Constitutional: Vital signs are normal. He appears well-developed and well-nourished.  HENT:  Head: Normocephalic.  Right Ear: Hearing normal.  Left Ear: Hearing normal.  Nose: Nose normal.  Mouth/Throat: Oropharynx is clear and moist and mucous membranes are normal.  Neck: Trachea normal. Carotid bruit is not present. No mass and no thyromegaly present.  Cardiovascular: Normal rate, regular rhythm and normal pulses.  Exam reveals no gallop, no distant heart sounds and no friction rub.   No murmur heard.      No peripheral edema Prominent thigh and calf varicose veins.  Pulmonary/Chest: Effort normal and breath sounds normal. No respiratory distress.  Neurological:       No tremor  Skin: Skin is warm, dry and intact. No rash noted.  Psychiatric: He has a normal mood and affect. His speech is normal and behavior is normal. Thought content normal.  His mood appears not anxious.          Assessment & Plan:

## 2010-09-29 ENCOUNTER — Other Ambulatory Visit: Payer: Self-pay | Admitting: *Deleted

## 2010-09-29 MED ORDER — AMPHETAMINE-DEXTROAMPHET ER 25 MG PO CP24: ORAL_CAPSULE | ORAL | Status: DC

## 2010-09-29 NOTE — Telephone Encounter (Signed)
Will refill while Dr. Ermalene Searing is on vacation.

## 2010-09-29 NOTE — Telephone Encounter (Signed)
Please notify patient when Rx is ready for pick up.

## 2010-10-07 ENCOUNTER — Encounter: Payer: Self-pay | Admitting: Vascular Surgery

## 2010-10-19 ENCOUNTER — Encounter: Payer: Self-pay | Admitting: Vascular Surgery

## 2010-10-20 ENCOUNTER — Encounter: Payer: Self-pay | Admitting: Vascular Surgery

## 2010-10-20 ENCOUNTER — Ambulatory Visit (INDEPENDENT_AMBULATORY_CARE_PROVIDER_SITE_OTHER): Payer: BC Managed Care – PPO | Admitting: Vascular Surgery

## 2010-10-20 ENCOUNTER — Other Ambulatory Visit: Payer: BC Managed Care – PPO

## 2010-10-20 VITALS — BP 124/84 | HR 108 | Resp 20 | Ht 76.0 in | Wt 240.0 lb

## 2010-10-20 DIAGNOSIS — I83893 Varicose veins of bilateral lower extremities with other complications: Secondary | ICD-10-CM

## 2010-10-20 NOTE — Progress Notes (Signed)
The patient is a 35 year old gentleman with a long history of bilateral venous hypertension. He had treatment at a vein center in [facility]. As was his right leg only. He has had progressive difficulty with more pain swelling and skin changes over the past several years. This is worse on his right leg but is also on his left leg. He reports pain over the varicosities in his thigh and calf with prolonged standing. This is described as an achy sensation. It is slightly relieved with rest. He has worn compression garments with no benefit. He has no history of DVT, venous bleeding, or superficial thrombophlebitis.  Past Medical History  Diagnosis Date  . Tobacco abuse   . Unspecified asthma   . Pollen allergies   . 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 ADD (attention deficit disorder) without hyperactivity     History  Substance Use Topics  . Smoking status: Former Smoker -- 2 years    Types: Cigarettes    Quit date: 10/19/2004  . Smokeless tobacco: Never Used  . Alcohol Use: No    Family History  Problem Relation Age of Onset  . Heart disease Mother   . Heart attack Father 29  . Hypertension Father   . Hyperlipidemia Brother   . Cancer Brother     throat  . Cancer Maternal Grandfather     lung  . Hyperlipidemia Brother   . Hyperlipidemia Brother     Not on File  Current outpatient prescriptions:amphetamine-dextroamphetamine (ADDERALL XR, 25MG ,) 25 MG 24 hr capsule, Take one by mouth daily, Disp: 30 capsule, Rfl: 0;  Fluticasone-Salmeterol (ADVAIR DISKUS) 100-50 MCG/DOSE AEPB, Inhale 1 puff into the lungs 1 dose over 24 hours., Disp: 90 each, Rfl: 4;  albuterol (PROAIR HFA) 108 (90 BASE) MCG/ACT inhaler, Inhale 2 puffs into the lungs every 6 (six) hours as needed.  , Disp: , Rfl:  diclofenac (VOLTAREN) 75 MG EC tablet, Take 75 mg by mouth 2 (two) times daily.  , Disp: , Rfl:   BP 124/84  Pulse 108  Resp 20  Ht 6\' 4"  (1.93 m)  Wt 240 lb  (108.863 kg)  BMI 29.21 kg/m2  Body mass index is 29.21 kg/(m^2).      Review of systems negative except for history of present illness as reviewed from his records from a 08/24/2010 visit. Physical exam: Well-developed well-nourished white male in no acute distress. HEENT normal. Respirations nonlabored. 2+ radial and dorsalis pedis pulses. Abdomen soft no masses noted. Musculoskeletal no major deformities. Neurologic no focal weakness. Skin without ulcers or rashes. He does have very large marked varicosities in both calves and both thighs.  Sono site imaging revealed reflux in his great saphenous vein bilaterally.  Impression bilateral venous hypertension with marked saphenous vein varicosities bilaterally.  Plan: The patient will initiate use of thigh high graduated compression garments and is instructed on the use of these. He will continue with elevation and Advil for pain. I will see him in 3 months to determine if conservative therapy is effective. He will have a formal venous duplex bilaterally at that time. He would be a candidate for staged bilateral laser ablation and stab phlebectomy for relief of his symptoms if he fails conservative treatment.

## 2010-11-08 ENCOUNTER — Other Ambulatory Visit: Payer: Self-pay | Admitting: *Deleted

## 2010-11-08 MED ORDER — AMPHETAMINE-DEXTROAMPHET ER 25 MG PO CP24: ORAL_CAPSULE | ORAL | Status: DC

## 2010-11-08 NOTE — Telephone Encounter (Signed)
Patient advised rx ready for pick up 

## 2010-11-08 NOTE — Telephone Encounter (Signed)
Please call pt when ready.

## 2010-11-18 ENCOUNTER — Telehealth: Payer: Self-pay

## 2010-11-18 NOTE — Telephone Encounter (Signed)
Spoke with patients wife and immunization faxed to school

## 2010-11-18 NOTE — Telephone Encounter (Signed)
Pt's wife Vernona Rieger request immunization record for pt's college. In pt's electronic file I see 3 immunizations only and I looked in pts paper chart and no immunizations found. Herbert Seta can you check [facility] and see if more immun available. Vernona Rieger wants call back [phone]. Thank you.

## 2010-11-25 ENCOUNTER — Ambulatory Visit (INDEPENDENT_AMBULATORY_CARE_PROVIDER_SITE_OTHER): Payer: BC Managed Care – PPO | Admitting: *Deleted

## 2010-11-25 DIAGNOSIS — Z23 Encounter for immunization: Secondary | ICD-10-CM

## 2010-11-26 ENCOUNTER — Ambulatory Visit (INDEPENDENT_AMBULATORY_CARE_PROVIDER_SITE_OTHER)
Admission: RE | Admit: 2010-11-26 | Discharge: 2010-11-26 | Disposition: A | Discharge status: Home / Self Care | Payer: BC Managed Care – PPO | Source: Ambulatory Visit | Attending: Family Medicine | Admitting: Family Medicine

## 2010-11-26 DIAGNOSIS — J984 Other disorders of lung: Secondary | ICD-10-CM

## 2010-11-26 MED ORDER — IOHEXOL 300 MG/ML  SOLN
80.0000 mL | Freq: Once | INTRAMUSCULAR | Status: AC | PRN
  Administered 2010-11-26: 80 mL via INTRAVENOUS

## 2010-12-09 ENCOUNTER — Other Ambulatory Visit: Payer: Self-pay | Admitting: *Deleted

## 2010-12-09 MED ORDER — AMPHETAMINE-DEXTROAMPHET ER 25 MG PO CP24: ORAL_CAPSULE | ORAL | Status: DC

## 2010-12-09 NOTE — Telephone Encounter (Signed)
Patient advised rx ready for pick up 

## 2010-12-09 NOTE — Telephone Encounter (Signed)
Please call patient when ready. 

## 2011-01-14 ENCOUNTER — Ambulatory Visit (INDEPENDENT_AMBULATORY_CARE_PROVIDER_SITE_OTHER): Payer: BC Managed Care – PPO | Admitting: Family Medicine

## 2011-01-14 ENCOUNTER — Encounter: Payer: Self-pay | Admitting: Family Medicine

## 2011-01-14 DIAGNOSIS — J069 Acute upper respiratory infection, unspecified: Secondary | ICD-10-CM

## 2011-01-14 NOTE — Progress Notes (Signed)
Initially with clear but then discolored rhinorrhea, then aches/cough.  Asthma is well controlled usually on 1/day dosing (or less) of advair  duration of symptoms: ~1 week Rhinorrhea: yes Congestion: yes ear pain: no sore throat: yes Cough: yes, triggered with deep breath Myalgias: yes other concerns: episodic fevers.  Voice change noted Aches and cough worse at night.   ROS: See HPI.  Otherwise negative.    Meds, vitals, and allergies reviewed.   GEN: nad, alert and oriented HEENT: mucous membranes moist, TM w/o erythema, nasal epithelium injected, OP with cobblestoning but no exudates NECK: supple w/o LA CV: rrr. PULM: ctab, no inc wob, no wheeze and no focal dec in BS, no ronchi.  ABD: soft, +bs EXT: no edema

## 2011-01-14 NOTE — Patient Instructions (Signed)
I would use the advair twice a day and the proair every 4-6 hours as needed for cough.  This should gradually get better.  Take care.

## 2011-01-16 ENCOUNTER — Encounter: Payer: Self-pay | Admitting: Family Medicine

## 2011-01-16 DIAGNOSIS — J069 Acute upper respiratory infection, unspecified: Secondary | ICD-10-CM | POA: Insufficient documentation

## 2011-01-16 NOTE — Assessment & Plan Note (Signed)
ddx d/w pt.  Benign exam and likely viral.  I would expect this to gradually improve.  I would use SABA and advair now to dec the cough. F/u prn.  At this point, risk of abx likely outweighs the benefits.

## 2011-01-17 ENCOUNTER — Other Ambulatory Visit: Payer: Self-pay | Admitting: *Deleted

## 2011-01-17 MED ORDER — AMPHETAMINE-DEXTROAMPHET ER 25 MG PO CP24: ORAL_CAPSULE | ORAL | Status: DC

## 2011-01-17 NOTE — Telephone Encounter (Signed)
Patient advised rx ready for pick up 

## 2011-01-17 NOTE — Telephone Encounter (Signed)
Please call pt when ready.

## 2011-01-24 ENCOUNTER — Encounter: Payer: Self-pay | Admitting: Vascular Surgery

## 2011-01-25 ENCOUNTER — Encounter: Payer: Self-pay | Admitting: Vascular Surgery

## 2011-01-25 ENCOUNTER — Other Ambulatory Visit (INDEPENDENT_AMBULATORY_CARE_PROVIDER_SITE_OTHER): Payer: BC Managed Care – PPO | Admitting: *Deleted

## 2011-01-25 ENCOUNTER — Ambulatory Visit (INDEPENDENT_AMBULATORY_CARE_PROVIDER_SITE_OTHER): Payer: BC Managed Care – PPO | Admitting: Vascular Surgery

## 2011-01-25 VITALS — BP 136/99 | HR 98 | Resp 16 | Ht 76.0 in | Wt 245.0 lb

## 2011-01-25 DIAGNOSIS — I83893 Varicose veins of bilateral lower extremities with other complications: Secondary | ICD-10-CM

## 2011-01-25 NOTE — Progress Notes (Signed)
Problems with Activities of Daily Living Secondary to Leg Pain  1. [name] states yard work is very difficult due to leg pain.  2. [name] states he has had to alter his exercise regimen (less running, lighter weights) due to leg pain.   3. [name] states falling asleep is difficult for him due to leg pain.   Rankin, [name]   Failure of  Conservative Therapy:  1. Worn 20-30 mm Hg thigh high compression hose >3 months with no relief of symptoms.  2. Frequently elevates legs-no relief of symptoms  3. Taken Ibuprofen 600 Mg TID with no relief of symptoms.  The patient has today for continued discussion of his severe bilateral venous hypertension. He has had no improvement in his graduated compression garment use. He does elevate with possible takes ibuprofen for discomfort. Despite this he does have pain specifically over his large bilateral varicosities and also aching sensation in his calves bilaterally. He also has significant changes of venous hypertension at the level of his ankles bilaterally.  Past Medical History  Diagnosis Date  . Tobacco abuse   . Unspecified asthma   . Pollen allergies   . 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 ADD (attention deficit disorder) without hyperactivity     History  Substance Use Topics  . Smoking status: Former Smoker -- 2 years    Types: Cigarettes    Quit date: 10/19/2004  . Smokeless tobacco: Never Used  . Alcohol Use: No    Family History  Problem Relation Age of Onset  . Heart disease Mother   . Heart attack Father 50  . Hypertension Father   . Hyperlipidemia Brother   . Cancer Brother     throat  . Cancer Maternal Grandfather     lung  . Hyperlipidemia Brother   . Hyperlipidemia Brother     No Known Allergies  Current outpatient prescriptions:albuterol (PROAIR HFA) 108 (90 BASE) MCG/ACT inhaler, Inhale 2 puffs into the lungs every 6 (six) hours as needed.  , Disp: ,  Rfl: ;  amphetamine-dextroamphetamine (ADDERALL XR, 25MG ,) 25 MG 24 hr capsule, Take one by mouth daily, Disp: 30 capsule, Rfl: 0;  Fluticasone-Salmeterol (ADVAIR DISKUS) 100-50 MCG/DOSE AEPB, Inhale 1 puff into the lungs 1 dose over 24 hours., Disp: 90 each, Rfl: 4  BP 136/99  Pulse 98  Resp 16  Ht 6\' 4"  (1.93 m)  Wt 245 lb (111.131 kg)  BMI 29.82 kg/m2  Body mass index is 29.82 kg/(m^2).       Review of systems no change  Physical exam well-developed well-nourished white male appearing stated age in no acute distress. HEENT normal. 2+ radial and 2+ dorsalis pedis pulses bilaterally. He does have marked varicosities in both thighs extending down to his calves and pretibial areas bilaterally. He does have some hemosiderin deposit in the level was ankles bilaterally.  Vascular lab: Reflux throughout his great saphenous vein in both thighs. Mild reflux in his right common femoral vein no deep venous reflux in the left and no evidence of DVT.  Depression and plan: Failed conservative treatment bilateral venous varicosities. I recommended staged laser ablation and stab phlebectomy for treatment of his venous hypertension. He reports that the right leg is more severe than the left and therefore we will start this leg. He understands procedure including potential risk of DVT which is quite unlikely.

## 2011-01-27 ENCOUNTER — Ambulatory Visit: Payer: BC Managed Care – PPO | Admitting: Vascular Surgery

## 2011-01-27 ENCOUNTER — Other Ambulatory Visit: Payer: BC Managed Care – PPO

## 2011-01-27 NOTE — Procedures (Unsigned)
LOWER EXTREMITY VENOUS REFLUX EXAM  INDICATION:  Varicose veins.  EXAM:  Using color-flow imaging and pulse Doppler spectral analysis, the bilateral common femoral, superficial femoral, popliteal, posterior tibial, greater and lesser saphenous veins are evaluated.  There is evidence suggesting deep venous reflux of >500 milliseconds noted in the right common femoral and popliteal veins.  There is no evidence suggesting deep venous insufficiency in the left lower extremity.  The bilateral saphenofemoral junctions and GSV's demonstrate reflux of >540milliseconds.   The bilateral proximal short saphenous veins demonstrates competency.  GSV Diameter (used if found to be incompetent only)                                           Right    Left Proximal Greater Saphenous Vein           0.72 cm  0.50 cm Proximal-to-mid-thigh                     cm       cm Mid thigh                                 0.66 cm  0.38 cm Mid-distal thigh                          cm       cm Distal thigh                              0.66 cm  0.61 cm Knee                                      0.30 cm  0.60 cm  IMPRESSION:  The bilateral great saphenous, right common femoral, and right popliteal veins demonstrate reflux >500 milliseconds.  ___________________________________________ Larina Earthly, M.D.  CH/MEDQ  D:  01/25/2011  T:  01/25/2011  Job:  [id number]

## 2011-02-09 ENCOUNTER — Telehealth: Payer: Self-pay | Admitting: Family Medicine

## 2011-02-09 NOTE — Telephone Encounter (Signed)
Patient scheduled appt but, may go to urgent care tonight

## 2011-02-09 NOTE — Telephone Encounter (Signed)
Wife called and reported patient fell last night and has injured is pelvic and abdominal area and would like a call back at [phone]

## 2011-02-10 ENCOUNTER — Encounter: Payer: BC Managed Care – PPO | Admitting: Family Medicine

## 2011-02-17 ENCOUNTER — Other Ambulatory Visit: Payer: Self-pay | Admitting: Internal Medicine

## 2011-02-17 MED ORDER — AMPHETAMINE-DEXTROAMPHET ER 25 MG PO CP24: ORAL_CAPSULE | ORAL | Status: DC

## 2011-02-17 NOTE — Telephone Encounter (Signed)
Patient advised rx ready for pick up 

## 2011-02-17 NOTE — Telephone Encounter (Signed)
Refill request for Adderall

## 2011-02-18 ENCOUNTER — Ambulatory Visit: Payer: BC Managed Care – PPO | Admitting: Family Medicine

## 2011-02-24 ENCOUNTER — Other Ambulatory Visit (INDEPENDENT_AMBULATORY_CARE_PROVIDER_SITE_OTHER): Payer: BC Managed Care – PPO

## 2011-02-24 DIAGNOSIS — Z13220 Encounter for screening for lipoid disorders: Secondary | ICD-10-CM

## 2011-02-24 DIAGNOSIS — R030 Elevated blood-pressure reading, without diagnosis of hypertension: Secondary | ICD-10-CM

## 2011-02-24 LAB — COMPREHENSIVE METABOLIC PANEL
ALT: 43 U/L (ref 0–53)
AST: 20 U/L (ref 0–37)
Calcium: 9.5 mg/dL (ref 8.4–10.5)
Chloride: 102 mEq/L (ref 96–112)
Creatinine, Ser: 1.1 mg/dL (ref 0.4–1.5)
Sodium: 139 mEq/L (ref 135–145)
Total Bilirubin: 1.3 mg/dL — ABNORMAL HIGH (ref 0.3–1.2)
Total Protein: 7.7 g/dL (ref 6.0–8.3)

## 2011-02-24 LAB — LDL CHOLESTEROL, DIRECT: Direct LDL: 148 mg/dL

## 2011-02-24 LAB — LIPID PANEL
HDL: 39.3 mg/dL (ref >39.00)
VLDL: 45.8 mg/dL — ABNORMAL HIGH (ref 0.0–40.0)

## 2011-03-03 ENCOUNTER — Ambulatory Visit (INDEPENDENT_AMBULATORY_CARE_PROVIDER_SITE_OTHER): Payer: BC Managed Care – PPO | Admitting: Family Medicine

## 2011-03-03 ENCOUNTER — Encounter: Payer: Self-pay | Admitting: Family Medicine

## 2011-03-03 DIAGNOSIS — E781 Pure hyperglyceridemia: Secondary | ICD-10-CM

## 2011-03-03 DIAGNOSIS — J45909 Unspecified asthma, uncomplicated: Secondary | ICD-10-CM

## 2011-03-03 DIAGNOSIS — Z0000 Encounter for general adult medical examination without abnormal findings: Secondary | ICD-10-CM

## 2011-03-03 DIAGNOSIS — R030 Elevated blood-pressure reading, without diagnosis of hypertension: Secondary | ICD-10-CM

## 2011-03-03 DIAGNOSIS — E7800 Pure hypercholesterolemia, unspecified: Secondary | ICD-10-CM | POA: Insufficient documentation

## 2011-03-03 DIAGNOSIS — F988 Other specified behavioral and emotional disorders with onset usually occurring in childhood and adolescence: Secondary | ICD-10-CM

## 2011-03-03 NOTE — Assessment & Plan Note (Signed)
Well controlled on no med. Encouraged exercise, weight loss, healthy eating habits.   

## 2011-03-03 NOTE — Assessment & Plan Note (Signed)
Stable on advair. No rescue inhaler use.

## 2011-03-03 NOTE — Assessment & Plan Note (Signed)
Increase fish oil. LDL almost at goal 130-160. Counseled on lifestyle changes and weight loss.

## 2011-03-03 NOTE — Patient Instructions (Signed)
Fish oil, increase to 2 tabs po twice a day. Regular exercise, weight loss, low fat , low chol diet.

## 2011-03-03 NOTE — Progress Notes (Signed)
Subjective:    Patient ID: Aaron Tyler, male    DOB: 11-20-1975, 36 y.o.   MRN: [id number]  HPI  The patient is here for annual wellness exam and preventative care.    BP: well controlled today.  ADD: stable control on adderall XR. Good concentration.  Asthma, moderate persistant: On advair every other day. Has not required  albuterol in last 6 -12 months.  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    Review of Systems  Constitutional: Negative for fever, fatigue and unexpected weight change.  HENT: Negative for ear pain, congestion, sore throat, rhinorrhea, trouble swallowing and postnasal drip.   Eyes: Negative for pain.  Respiratory: Negative for cough, shortness of breath and wheezing.   Cardiovascular: Negative for chest pain, palpitations and leg swelling.  Gastrointestinal: Negative for nausea, abdominal pain, diarrhea, constipation and blood in stool.  Genitourinary: Negative for dysuria, urgency, hematuria, discharge, penile swelling, scrotal swelling, difficulty urinating, penile pain and testicular pain.       No concern for STD testing.  Skin: Negative for rash.  Neurological: Negative for syncope, weakness, light-headedness, numbness and headaches.  Psychiatric/Behavioral: Negative for behavioral problems and dysphoric mood. The patient is not nervous/anxious.        Objective:   Physical Exam  Constitutional: He appears well-developed and well-nourished.  Non-toxic appearance. He does not appear ill. No distress.  HENT:  Head: Normocephalic and atraumatic.  Right Ear: Hearing, tympanic membrane, external ear and ear canal normal.  Left Ear: Hearing, tympanic membrane, external ear and ear canal normal.  Nose: Nose normal.  Mouth/Throat: Uvula is midline, oropharynx is clear and moist and mucous membranes are normal.  Eyes: Conjunctivae, EOM and lids are normal. Pupils are equal, round, and reactive to light. No foreign bodies found.  Neck: Trachea normal, normal  range of motion and phonation normal. Neck supple. Carotid bruit is not present. No mass and no thyromegaly present.  Cardiovascular: Normal rate, regular rhythm, S1 normal, S2 normal, intact distal pulses and normal pulses.  Exam reveals no gallop.   No murmur heard. Pulmonary/Chest: Breath sounds normal. He has no wheezes. He has no rhonchi. He has no rales.  Abdominal: Soft. Normal appearance and bowel sounds are normal. There is no hepatosplenomegaly. There is no tenderness. There is no rebound, no guarding and no CVA tenderness. No hernia. Hernia confirmed negative in the right inguinal area and confirmed negative in the left inguinal area.  Genitourinary: Testes normal and penis normal. Right testis shows no mass and no tenderness. Left testis shows no mass and no tenderness. No paraphimosis or penile tenderness.  Lymphadenopathy:    He has no cervical adenopathy.       Right: No inguinal adenopathy present.       Left: No inguinal adenopathy present.  Neurological: He is alert. He has normal strength and normal reflexes. No cranial nerve deficit or sensory deficit. Gait normal.  Skin: Skin is warm, dry and intact. No rash noted.  Psychiatric: He has a normal mood and affect. His speech is normal and behavior is normal. Judgment normal.          Assessment & Plan:  CPX: The patient's preventative maintenance and recommended screening tests for an annual wellness exam were reviewed in full today. Brought up to date unless services declined.  Counselled on the importance of diet, exercise, and its role in overall health and mortality. The patient's FH and SH was reviewed, including their home life, tobacco status, and drug  and alcohol status.   Vaccines: Td, PNA uptodate.  Not interested in flu vaccine.

## 2011-03-22 ENCOUNTER — Telehealth: Payer: Self-pay | Admitting: Family Medicine

## 2011-03-22 MED ORDER — AMPHETAMINE-DEXTROAMPHET ER 25 MG PO CP24: ORAL_CAPSULE | ORAL | Status: DC

## 2011-03-22 NOTE — Telephone Encounter (Signed)
Pt called request Rx for : Adderall and

## 2011-03-22 NOTE — Telephone Encounter (Signed)
Pt called request Rx for Adderall And Oxycodine, says this medication was originally given to him by a physician at an Urgent care for back pain. Pt says it was discuss by himself and Dr. Ermalene Searing at his last office visit.Aaron KitchenMarland KitchenMarland Tyler

## 2011-03-22 NOTE — Telephone Encounter (Signed)
Cannot prescribe narcotic with out verification, alos not a great longterm med.... Get record from Urgent care. Let me review before prescribing.  Okay to refill Adderall  For 1 month.

## 2011-03-22 NOTE — Telephone Encounter (Signed)
Patient advised rx ready for adderall and asked him to call back with urgent care info

## 2011-03-24 ENCOUNTER — Encounter: Payer: Self-pay | Admitting: Family Medicine

## 2011-03-24 ENCOUNTER — Ambulatory Visit (INDEPENDENT_AMBULATORY_CARE_PROVIDER_SITE_OTHER): Payer: BC Managed Care – PPO | Admitting: Family Medicine

## 2011-03-24 VITALS — BP 118/84 | HR 80 | Temp 97.8°F | Wt 251.5 lb

## 2011-03-24 DIAGNOSIS — H6690 Otitis media, unspecified, unspecified ear: Secondary | ICD-10-CM

## 2011-03-24 MED ORDER — HYDROCORTISONE-ACETIC ACID 1-2 % OT SOLN: 4.0000 [drp] | Freq: Two times a day (BID) | OTIC | Status: AC

## 2011-03-24 MED ORDER — AMOXICILLIN-POT CLAVULANATE 875-125 MG PO TABS: 1.0000 | ORAL_TABLET | Freq: Two times a day (BID) | ORAL | Status: AC

## 2011-03-24 NOTE — Patient Instructions (Signed)
You have external and middle ear infection.  Treat with amoxicillin twice daily for 7 days as well as vosol ear drops for mild external ear infection. Use ibuprofen for inflammation/pain. Continue salt water gargles. Update [place] if not better after this.

## 2011-03-24 NOTE — Assessment & Plan Note (Addendum)
Both external and middle ear infection. Treat with amox x 7 days as well as vosol for left external component. Update [place] if not improving as expected. No perf today.

## 2011-03-24 NOTE — Progress Notes (Addendum)
  Subjective:    Patient ID: Aaron Tyler, male    DOB: [birth year], 36 y.o.   MRN: [id number]  HPI CC: ear, neck pain  10d ago hit head on wife's jeep door.  Then started having left neck, ear pain, for 10 days now having ST, RN, nasal congestion.  Continued constant throbbing in R ear.  Also noticed bumps left scalp area.  Wonders if due to wax that he places in hair.    Tried tylenol for L ear pain.  No fevers/chills, abd pain, n/v/d, tooth pain, coughing.  No draining from ears, muffled hearing.  + fm hx throat cancer - brothers with throat cancer.  No smokers at home.  Both children with ear infections recently.  + h/o asthma, uses advair qod and albuterol as needed.  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  Review of Systems Per HPI    Objective:   Physical Exam  Nursing note and vitals reviewed. Constitutional: He appears well-developed and well-nourished. No distress.  HENT:  Head: Normocephalic and atraumatic.  Right Ear: Hearing, tympanic membrane, external ear and ear canal normal.  Left Ear: Hearing and external ear normal.  Nose: Nose normal. No mucosal edema or rhinorrhea. Right sinus exhibits no maxillary sinus tenderness and no frontal sinus tenderness. Left sinus exhibits no maxillary sinus tenderness and no frontal sinus tenderness.  Mouth/Throat: Uvula is midline, oropharynx is clear and moist and mucous membranes are normal. No oropharyngeal exudate, posterior oropharyngeal edema, posterior oropharyngeal erythema or tonsillar abscesses.       Slight erythema of L TM but good light reflex.  fluid behind L TM.  Some erythema of external canal and slight exudate outer 1/3 external canal on left Able to fully open/close mouth  Eyes: Conjunctivae and EOM are normal. Pupils are equal, round, and reactive to light. No scleral icterus.  Neck: Normal range of motion. Neck supple.  Cardiovascular: Normal rate, regular rhythm, normal heart sounds  and intact distal pulses.   No murmur heard. Pulmonary/Chest: Effort normal and breath sounds normal. No respiratory distress. He has no wheezes. He has no rales.  Lymphadenopathy:       Head (right side): No posterior auricular adenopathy present.       Head (left side): Posterior auricular adenopathy present.    He has cervical adenopathy (L inferior post cervical LAD, tender).       Right: No supraclavicular adenopathy present.       Left: No supraclavicular adenopathy present.  Skin: Skin is warm and dry. Rash noted.       Left scalp posterior to ear with 2 cherry angiomas      Assessment & Plan:

## 2011-03-25 ENCOUNTER — Telehealth: Payer: Self-pay | Admitting: *Deleted

## 2011-03-25 MED ORDER — HYDROCODONE-ACETAMINOPHEN 5-500 MG PO TABS: 1.0000 | ORAL_TABLET | Freq: Three times a day (TID) | ORAL | Status: AC | PRN

## 2011-03-25 NOTE — Telephone Encounter (Signed)
Patient called and has been alternating tylenol and ibuprofen and has not had any relief on his ear pain. He is requesting something very short term just to get through until the abx kicks in. He said he barely slept last night due to the pain.

## 2011-03-25 NOTE — Telephone Encounter (Signed)
Sent in short course hydrocodone.  Called into pharm and called pt. If not improved, to return for f/u next week.

## 2011-03-31 ENCOUNTER — Ambulatory Visit (INDEPENDENT_AMBULATORY_CARE_PROVIDER_SITE_OTHER): Payer: BC Managed Care – PPO | Admitting: Family Medicine

## 2011-03-31 ENCOUNTER — Encounter: Payer: Self-pay | Admitting: Family Medicine

## 2011-03-31 VITALS — BP 126/82 | HR 64 | Temp 98.4°F | Wt 251.0 lb

## 2011-03-31 DIAGNOSIS — H6690 Otitis media, unspecified, unspecified ear: Secondary | ICD-10-CM

## 2011-03-31 MED ORDER — GABAPENTIN 300 MG PO CAPS: 300.0000 mg | ORAL_CAPSULE | Freq: Every day | ORAL | Status: DC

## 2011-03-31 NOTE — Progress Notes (Signed)
  Subjective:    Patient ID: Aaron Tyler, male    DOB: Jan 17, 1976, 36 y.o.   MRN: [id number]  HPI CC: f/u ear infection  Pain improved but soreness still present L outer ear as well as jaw and neck.  Used advil and hydrodocone to be able to sleep.  Left scalp rash improving.  Did start after recent haircut.  No h/o cold sores.  + h/o chicken pox.  Review of Systems Per HPI    Objective:   Physical Exam  Nursing note and vitals reviewed. Constitutional: He appears well-developed and well-nourished. No distress.  HENT:  Head: Normocephalic and atraumatic.  Right Ear: Hearing, tympanic membrane, external ear and ear canal normal.  Left Ear: Hearing and external ear normal.  Nose: Nose normal. No mucosal edema or rhinorrhea.  Mouth/Throat: Uvula is midline, oropharynx is clear and moist and mucous membranes are normal. No oropharyngeal exudate, posterior oropharyngeal edema, posterior oropharyngeal erythema or tonsillar abscesses.       L TM - no erythema or bulge.  External canal still some maceration, drainage present, dry skin pinna.  Lymphadenopathy:       Head (right side): No posterior auricular adenopathy present.       Head (left side): Posterior auricular adenopathy present.    He has no cervical adenopathy.  Skin: Skin is warm and dry. Rash noted.       Resolving minimal crusting lesions throughout left hemicranium scalp, no vesicles       Assessment & Plan:

## 2011-03-31 NOTE — Patient Instructions (Signed)
I wonder if scalp rash due to shingles - try gabapentin 300mg  at night instead of hydrocodone to see if will help pain. If shingles, you're not contagious as lesions are closed over. Let me know if ear pain not improving as expected, would consider another course of antibiotic ear drops. Good to see you today.  Call us with questions.

## 2011-03-31 NOTE — Assessment & Plan Note (Addendum)
i wonder if initial rash was shingles, discussed this.  May have residual pain from this but hopeful to resolve over next several weeks.  Trial gabapentin.  outside of window for antiviral. L AOM resolved.  Finish abx orally.  No perf today. L external otitis slowly resolving, advised to notify me if continued external ear pain, would consider stronger abx ear drop.

## 2011-04-14 ENCOUNTER — Telehealth: Payer: Self-pay | Admitting: *Deleted

## 2011-04-14 NOTE — Telephone Encounter (Signed)
Dr. Tawanna Cooler Early spoke with Dr. Kristine Garbe of [facility] and [facility] on behalf of Aaron Tyler in a peer-to-peer conference call to appeal denial of office surgery.  CPT code 40981 for left Greater Saphenous vein had previously been denied.  Dr. Kristine Garbe approved/authorized CPT [phone] for left Greater Saphenous vein after discussing the case with Dr. Gretta Began.  Authorization # [id number].   Aaron Tyler, [name]

## 2011-04-25 ENCOUNTER — Other Ambulatory Visit: Payer: Self-pay | Admitting: *Deleted

## 2011-04-26 ENCOUNTER — Other Ambulatory Visit: Payer: Self-pay | Admitting: *Deleted

## 2011-04-26 MED ORDER — AMPHETAMINE-DEXTROAMPHET ER 25 MG PO CP24: ORAL_CAPSULE | ORAL | Status: DC

## 2011-04-26 NOTE — Telephone Encounter (Signed)
  Okay to refill.. Print out for [place] to sign.

## 2011-04-26 NOTE — Telephone Encounter (Signed)
In your inbox to be signed

## 2011-04-26 NOTE — Telephone Encounter (Signed)
Duplicate message. 

## 2011-05-05 ENCOUNTER — Other Ambulatory Visit: Payer: Self-pay | Admitting: *Deleted

## 2011-05-05 DIAGNOSIS — I83893 Varicose veins of bilateral lower extremities with other complications: Secondary | ICD-10-CM

## 2011-05-13 ENCOUNTER — Emergency Department: Payer: Self-pay | Admitting: *Deleted

## 2011-05-30 ENCOUNTER — Telehealth: Payer: Self-pay | Admitting: Family Medicine

## 2011-05-30 MED ORDER — AMPHETAMINE-DEXTROAMPHET ER 25 MG PO CP24: ORAL_CAPSULE | ORAL | Status: DC

## 2011-05-30 NOTE — Telephone Encounter (Signed)
ready

## 2011-05-30 NOTE — Telephone Encounter (Signed)
Caller: Brantly Ross/Patient; PCP: Kerby Nora E.; CB#: [phone]; Call regarding Medication Question; Adderall - unsure of dose.  Needs refill. States Dr. Patsy Lager has been filling it for him in the absence of Dr. Ermalene Searing. Ran  out of medication on 05/28/11.  [facility]ses [facility] [address]  [phone].  Patient requests call when he can pick up Rx.  OFFICE, PLEASE FOLLOW UP WITH PATIENT ASAP REGARDING THIS MEDICATION. THANK YOU.

## 2011-05-31 NOTE — Telephone Encounter (Signed)
Patient advised rx ready for pick up 

## 2011-06-02 ENCOUNTER — Other Ambulatory Visit: Payer: Self-pay

## 2011-06-02 ENCOUNTER — Telehealth: Payer: Self-pay

## 2011-06-02 NOTE — Telephone Encounter (Signed)
Pt said his insurance co told him he would need PA for Advair. Pt to contact pharmacy to begin PA process.

## 2011-06-02 NOTE — Telephone Encounter (Signed)
Prior auth needed for advair, form is on your desk.

## 2011-06-02 NOTE — Telephone Encounter (Signed)
Received fax from [facility] requesting prior authorization for Advair 100-50 diskus; spoke with Precious at [phone] requesting PA form.

## 2011-06-07 NOTE — Telephone Encounter (Signed)
Form faxed

## 2011-06-08 NOTE — Telephone Encounter (Signed)
Prior auth given for advair, advised pharmacy.  Approval letter placed on doctor's desk for signature and scanning.

## 2011-06-15 ENCOUNTER — Encounter: Payer: Self-pay | Admitting: Vascular Surgery

## 2011-06-16 ENCOUNTER — Encounter: Payer: Self-pay | Admitting: Vascular Surgery

## 2011-06-16 ENCOUNTER — Ambulatory Visit (INDEPENDENT_AMBULATORY_CARE_PROVIDER_SITE_OTHER): Payer: BC Managed Care – PPO | Admitting: Vascular Surgery

## 2011-06-16 VITALS — BP 133/95 | HR 97 | Resp 18 | Ht 76.0 in | Wt 249.0 lb

## 2011-06-16 DIAGNOSIS — I83893 Varicose veins of bilateral lower extremities with other complications: Secondary | ICD-10-CM | POA: Insufficient documentation

## 2011-06-16 HISTORY — PX: ENDOVENOUS ABLATION SAPHENOUS VEIN W/ LASER: SUR449

## 2011-06-16 NOTE — Progress Notes (Signed)
Laser Ablation Procedure      Date: 06/16/2011    Aaron Tyler DOB:[birth year]  Consent signed: Yes  Surgeon:T.F. Idamay Hosein  Procedure: Laser Ablation: right Greater Saphenous Vein   BP 133/95  Pulse 97  Resp 18  Ht 6\' 4"  (1.93 m)  Wt 249 lb (112.946 kg)  BMI 30.31 kg/m2  Start time: 9:00AM   End time: 10:40AM  Tumescent Anesthesia: 800 cc 0.9% NaCl with 50 cc Lidocaine HCL with 1% Epi and 15 cc 8.4% NaHCO3  Local Anesthesia: 3 cc Lidocaine HCL and NaHCO3 (ratio 2:1)  Continuous Mode: 15 Watts Total Energy 1943 Joules Total Time2:09     Stab Phlebectomy: >20   RIGHT LEG Sites: Thigh and Calf  Patient tolerated procedure well: Yes  Rankin, [name]  Description of Procedure:  After marking the course of the saphenous vein and the secondary varicosities in the standing position, the patient was placed on the operating table in the supine position, and the right leg was prepped and draped in sterile fashion. Local anesthetic was administered, and under ultrasound guidance the saphenous vein was accessed with a micro needle and guide wire; then the micro puncture sheath was placed. A guide wire was inserted to the saphenofemoral junction, followed by a 5 french sheath.  The position of the sheath and then the laser fiber below the junction was confirmed using the ultrasound and visualization of the aiming beam.  Tumescent anesthesia was administered along the course of the saphenous vein using ultrasound guidance. Protective laser glasses were placed on the patient, and the laser was fired at at 15 watt continuous mode.  For a total of 1943 joules.  A steri strip was applied to the puncture site.  The patient was then put into Trendelenburg position.  Local anesthetic was utilized overlying the marked varicosities.  Greater than >20 stab wounds were made using the tip of an 11 blade; and using the vein hook,  The phlebectomies were performed using a hemostat to avulse these  varicosities.  Adequate hemostasis was achieved, and steri strips were applied to the stab wound.      ABD pads and thigh high compression stockings were applied.  Ace wrap bandages were applied over the phlebectomy sites and at the top of the saphenofemoral junction.  Blood loss was less than 15 cc.  The patient ambulated out of the operating room having tolerated the procedure well.

## 2011-06-17 ENCOUNTER — Telehealth: Payer: Self-pay | Admitting: *Deleted

## 2011-06-17 NOTE — Telephone Encounter (Signed)
Spoke to patient for post op check, doing well, no problems noted. States that he is just resting today and inquired of walking more on 06/18/11. Explained that walking is good, just not to be overly aggressive with activities. Patient understood instructions

## 2011-06-20 ENCOUNTER — Encounter: Payer: Self-pay | Admitting: Vascular Surgery

## 2011-06-22 ENCOUNTER — Encounter: Payer: Self-pay | Admitting: Vascular Surgery

## 2011-06-22 ENCOUNTER — Other Ambulatory Visit: Payer: Self-pay | Admitting: *Deleted

## 2011-06-22 DIAGNOSIS — I83893 Varicose veins of bilateral lower extremities with other complications: Secondary | ICD-10-CM

## 2011-06-22 HISTORY — PX: OTHER SURGICAL HISTORY: SHX169

## 2011-06-23 ENCOUNTER — Ambulatory Visit (INDEPENDENT_AMBULATORY_CARE_PROVIDER_SITE_OTHER): Payer: BC Managed Care – PPO | Admitting: Vascular Surgery

## 2011-06-23 ENCOUNTER — Encounter (INDEPENDENT_AMBULATORY_CARE_PROVIDER_SITE_OTHER): Payer: BC Managed Care – PPO | Admitting: *Deleted

## 2011-06-23 ENCOUNTER — Encounter: Payer: Self-pay | Admitting: Vascular Surgery

## 2011-06-23 VITALS — BP 139/100 | HR 106 | Resp 20 | Ht 76.0 in | Wt 249.0 lb

## 2011-06-23 DIAGNOSIS — I83893 Varicose veins of bilateral lower extremities with other complications: Secondary | ICD-10-CM

## 2011-06-23 DIAGNOSIS — Z48811 Encounter for surgical aftercare following surgery on the nervous system: Secondary | ICD-10-CM

## 2011-06-23 DIAGNOSIS — M7989 Other specified soft tissue disorders: Secondary | ICD-10-CM

## 2011-06-23 NOTE — Progress Notes (Signed)
Patient presents today for followup of his right leg laser ablation and multiple stab phlebectomy Z. his thigh and calf. He has the usual amount of bruising in discomfort. He has been very compliant with his compression.  Venous duplex reveals closure of his great saphenous vein and no evidence of DVT  Successful right leg ablation and stab phlebectomy. We will see him in July for treatment of his symptomatic left leg he will continue his compression garment for one additional week.

## 2011-06-23 NOTE — Progress Notes (Signed)
Patient ID: GRAINGER MCCARLEY, male   DOB: 10/19/1975, 36 y.o.   MRN: [id number]  Staff Message  Sent: Wed May 01,2013  2:25 PM Reche Dixon, RN  Message Please schedule post laser ablation duplex (order is in [facility]) and follow up with Dr. Arbie Cookey on 09-08-2011 for Jarvis Newcomer MRN # [id number]. Thanks! Lamar Laundry

## 2011-06-28 NOTE — Procedures (Unsigned)
DUPLEX DEEP VENOUS EXAM - LOWER EXTREMITY  INDICATION:  Status post ablation of the right lower extremity.  HISTORY:  Edema:  Yes Trauma/Surgery:  Ablation right greater saphenous vein 06/16/2011 Pain:  Yes PE:  No Previous DVT:  No Anticoagulants:  No Other:  DUPLEX EXAM:               CFV   SFV   PopV  PTV    GSV               R  L  R  L  R  L  R   L  R  L Thrombosis    0     0     0     0      + Spontaneous   +     +     +     +      0 Phasic        +     +     +     +      0 Augmentation  +     +     +     +      0 Compressible  +     +     +     +      0 Competent     0     +     +     +      0  Legend:  + - yes  o - no  p - partial  D - decreased  IMPRESSION:  The right greater saphenous vein is closed proximally on distally to the upper calf with some varicose veins closed as well. No evidence of right lower extremity DVT.   _____________________________ Larina Earthly, M.D.  SS/MEDQ  D:  06/24/2011  T:  06/24/2011  Job:  [id number]

## 2011-07-05 ENCOUNTER — Other Ambulatory Visit: Payer: Self-pay

## 2011-07-05 MED ORDER — AMPHETAMINE-DEXTROAMPHET ER 25 MG PO CP24: ORAL_CAPSULE | ORAL | Status: DC

## 2011-07-05 NOTE — Telephone Encounter (Signed)
pts wife Vernona Rieger request written rx Adderall XR 25 mg. Pt last seen 03/31/11.when rx ready for pick up call [phone].

## 2011-07-06 NOTE — Telephone Encounter (Signed)
Patient's wife notified as instructed by telephone. Prescription left at front desk.  

## 2011-08-19 ENCOUNTER — Other Ambulatory Visit: Payer: Self-pay | Admitting: Family Medicine

## 2011-09-01 ENCOUNTER — Other Ambulatory Visit: Payer: BC Managed Care – PPO | Admitting: Vascular Surgery

## 2011-09-08 ENCOUNTER — Ambulatory Visit: Payer: BC Managed Care – PPO | Admitting: Vascular Surgery

## 2011-09-14 ENCOUNTER — Encounter: Payer: Self-pay | Admitting: Vascular Surgery

## 2011-09-15 ENCOUNTER — Encounter: Payer: Self-pay | Admitting: Vascular Surgery

## 2011-09-15 ENCOUNTER — Ambulatory Visit (INDEPENDENT_AMBULATORY_CARE_PROVIDER_SITE_OTHER): Payer: BC Managed Care – PPO | Admitting: Vascular Surgery

## 2011-09-15 VITALS — BP 123/89 | HR 88 | Resp 18 | Ht 76.0 in | Wt 231.0 lb

## 2011-09-15 DIAGNOSIS — I83893 Varicose veins of bilateral lower extremities with other complications: Secondary | ICD-10-CM

## 2011-09-15 HISTORY — PX: ENDOVENOUS ABLATION SAPHENOUS VEIN W/ LASER: SUR449

## 2011-09-15 NOTE — Progress Notes (Signed)
Laser Ablation Procedure      Date: 09/15/2011    Aaron Tyler DOB:[birth year]  Consent signed: Yes  Surgeon:T.F. Early  Procedure: Laser Ablation: left Greater Saphenous Vein  BP 123/89  Pulse 88  Resp 18  Ht 6\' 4"  (1.93 m)  Wt 231 lb (104.781 kg)  BMI 28.12 kg/m2  Start time: 8:45AM   End time: 10:30 AM  Tumescent Anesthesia: 750 cc 0.9% NaCl with 50 cc Lidocaine HCL with 1% Epi and 15 cc 8.4% NaHCO3  Local Anesthesia: 5 cc Lidocaine HCL and NaHCO3 (ratio 2:1)  Continuous Mode: 15 Watts Total Energy 3210 Joules Total Time3:34     Stab Phlebectomy: >20 Sites: Thigh and Calf  Patient tolerated procedure well: Yes  Rankin, [name]  Description of Procedure:  After marking the course of the saphenous vein and the secondary varicosities in the standing position, the patient was placed on the operating table in the supine position, and the left leg was prepped and draped in sterile fashion. Local anesthetic was administered, and under ultrasound guidance the saphenous vein was accessed with a micro needle and guide wire; then the micro puncture sheath was placed. A guide wire was inserted to the saphenofemoral junction, followed by a 5 french sheath.  The position of the sheath and then the laser fiber below the junction was confirmed using the ultrasound and visualization of the aiming beam.  Tumescent anesthesia was administered along the course of the saphenous vein using ultrasound guidance. Protective laser glasses were placed on the patient, and the laser was fired at at 15 watt continuous mode.  For a total of 3210 joules.  A steri strip was applied to the puncture site.  The patient was then put into Trendelenburg position.  Local anesthetic was utilized overlying the marked varicosities.  Greater than >20 stab wounds were made using the tip of an 11 blade; and using the vein hook,  The phlebectomies were performed using a hemostat to avulse these varicosities.  Adequate  hemostasis was achieved, and steri strips were applied to the stab wound.      ABD pads and thigh high compression stockings were applied.  Ace wrap bandages were applied over the phlebectomy sites and at the top of the saphenofemoral junction.  Blood loss was less than 15 cc.  The patient ambulated out of the operating room having tolerated the procedure well.

## 2011-09-16 ENCOUNTER — Encounter: Payer: Self-pay | Admitting: Vascular Surgery

## 2011-09-21 ENCOUNTER — Telehealth: Payer: Self-pay | Admitting: *Deleted

## 2011-09-21 ENCOUNTER — Encounter: Payer: Self-pay | Admitting: Vascular Surgery

## 2011-09-21 NOTE — Telephone Encounter (Signed)
09/21/2011  Time: 10:53 AM   Patient Name: Aaron Tyler  Patient of: T.F. Early  Procedure:Laser Ablation left greater saphenous vein and stab phlebectomy >20 incisions left leg 09-15-2011  Reached patient at home and checked  His status  Yes    Comments/Actions Taken: Aaron Tyler states mild discomfort over left inner thigh relieved by Ibuprofen.  No complaints of bleeding/oozing or swelling.  Reviewed post procedural instructions and reminded Aaron Tyler of post laser ablation duplex and follow up appointment with Dr. Arbie Cookey on 09-22-2011.      @SIGNATURE @Rankin , [name]

## 2011-09-22 ENCOUNTER — Encounter: Payer: Self-pay | Admitting: Vascular Surgery

## 2011-09-22 ENCOUNTER — Ambulatory Visit (INDEPENDENT_AMBULATORY_CARE_PROVIDER_SITE_OTHER): Payer: BC Managed Care – PPO | Admitting: Vascular Surgery

## 2011-09-22 VITALS — BP 142/91 | HR 94 | Resp 18 | Ht 76.0 in | Wt 232.0 lb

## 2011-09-22 DIAGNOSIS — M79609 Pain in unspecified limb: Secondary | ICD-10-CM

## 2011-09-22 DIAGNOSIS — M7989 Other specified soft tissue disorders: Secondary | ICD-10-CM

## 2011-09-22 DIAGNOSIS — I83893 Varicose veins of bilateral lower extremities with other complications: Secondary | ICD-10-CM

## 2011-09-22 NOTE — Progress Notes (Signed)
This is a one week followup of laser ablation stab phlebectomy of his left great saphenous vein. A similar treatment in the right approximate one month ago. He does have usual manner bruising and discomfort associated with the phlebectomy of the extensive varicosities had to have his legs.  Duplex today reveals closure of his great saphenous vein and no evidence of DVT. He'll continue compression for one additional week and then used to sewn as-needed basis. He will see [place] again on an as-needed basis

## 2011-09-22 NOTE — Progress Notes (Signed)
Left post ablation venous duplex performed @ [facility] 09/22/2011

## 2011-09-28 NOTE — Procedures (Unsigned)
DUPLEX DEEP VENOUS EXAM - LOWER EXTREMITY  INDICATION:  Left lower extremity varicose veins,  pain and swelling.  HISTORY:  Edema:  Yes. Trauma/Surgery:  Left endovenous laser ablation on 09/15/2011. Pain:  Yes. PE:  No. Previous DVT:  No. Anticoagulants:  No. Other:  DUPLEX EXAM:               CFV   SFV   PopV  PTV    GSV               R  L  R  L  R  L  R   L  R  L Thrombosis       0     0     0      0     + Spontaneous      +     +     +      +     0 Phasic           +     +     +      +     0 Augmentation     +     +     +      +     0 Compressible     +     +     +      +     0 Competent        +     +     +      +  Legend:  + - yes  o - no  p - partial  D - decreased  IMPRESSION: 1. No evidence of deep vein thrombosis identified involving the left     lower extremity, patent and compressible. 2. Good post-ablation results the length of the left great saphenous     vein ablated. 3. Reflux greater than present involving the left     anterolateral branch arising off the left great saphenous vein from     the junction to the mid thigh segment. 4. The remainder of the anterolateral branch appears thrombosed.   _____________________________ Larina Earthly, M.D.  SH/MEDQ  D:  09/22/2011  T:  09/22/2011  Job:  [id number]

## 2011-10-18 ENCOUNTER — Other Ambulatory Visit: Payer: Self-pay

## 2011-10-18 MED ORDER — AMPHETAMINE-DEXTROAMPHET ER 25 MG PO CP24: ORAL_CAPSULE | ORAL | Status: DC

## 2011-10-18 NOTE — Telephone Encounter (Signed)
Pt request Adderall rx. Call when ready for pick up. 

## 2011-10-18 NOTE — Telephone Encounter (Signed)
Left message on voice mail advising pt script is ready for pick up.  Script placed at front desk.

## 2011-10-20 ENCOUNTER — Telehealth: Payer: Self-pay

## 2011-10-20 NOTE — Telephone Encounter (Signed)
Pt request samples Advair 100/50; no samples available; [facility]; pt cannot get refill from local phar due to ins guidelines. Pt will ck with [employer]. To see if can get refill locally until receive mail order.

## 2011-11-01 ENCOUNTER — Other Ambulatory Visit: Payer: Self-pay | Admitting: *Deleted

## 2011-11-01 MED ORDER — FLUTICASONE-SALMETEROL 100-50 MCG/DOSE IN AEPB: 1.0000 | INHALATION_SPRAY | Freq: Every day | RESPIRATORY_TRACT | Status: DC

## 2011-11-01 NOTE — Telephone Encounter (Signed)
Received faxed refill request from pharmacy. Refill sent to pharmacy electronically. 

## 2011-11-10 ENCOUNTER — Other Ambulatory Visit: Payer: Self-pay

## 2011-11-10 NOTE — Telephone Encounter (Signed)
Entered in error

## 2011-11-29 ENCOUNTER — Other Ambulatory Visit: Payer: Self-pay | Admitting: *Deleted

## 2011-11-29 MED ORDER — AMPHETAMINE-DEXTROAMPHET ER 25 MG PO CP24: ORAL_CAPSULE | ORAL | Status: DC

## 2011-11-29 NOTE — Telephone Encounter (Signed)
Patient notified that script is up front and ready for pickup. 

## 2011-11-29 NOTE — Telephone Encounter (Signed)
In printer box. 

## 2012-01-04 ENCOUNTER — Other Ambulatory Visit: Payer: Self-pay

## 2012-01-04 NOTE — Telephone Encounter (Signed)
pts wife left v/m requesting rx adderall.call when ready for pick up.

## 2012-01-05 MED ORDER — AMPHETAMINE-DEXTROAMPHET ER 25 MG PO CP24: ORAL_CAPSULE | ORAL | Status: DC

## 2012-01-05 NOTE — Telephone Encounter (Signed)
Print for my signature on Friday.

## 2012-01-31 ENCOUNTER — Ambulatory Visit (INDEPENDENT_AMBULATORY_CARE_PROVIDER_SITE_OTHER): Payer: BC Managed Care – PPO | Admitting: Family Medicine

## 2012-01-31 ENCOUNTER — Encounter: Payer: Self-pay | Admitting: Family Medicine

## 2012-01-31 VITALS — BP 120/84 | HR 102 | Temp 97.9°F | Ht 76.0 in | Wt 237.0 lb

## 2012-01-31 DIAGNOSIS — J208 Acute bronchitis due to other specified organisms: Secondary | ICD-10-CM

## 2012-01-31 DIAGNOSIS — J209 Acute bronchitis, unspecified: Secondary | ICD-10-CM

## 2012-01-31 DIAGNOSIS — B9689 Other specified bacterial agents as the cause of diseases classified elsewhere: Secondary | ICD-10-CM

## 2012-01-31 DIAGNOSIS — A499 Bacterial infection, unspecified: Secondary | ICD-10-CM

## 2012-01-31 MED ORDER — GUAIFENESIN-CODEINE 100-10 MG/5ML PO SOLN: 5.0000 mL | Freq: Three times a day (TID) | ORAL | Status: DC | PRN

## 2012-01-31 MED ORDER — AMPHETAMINE-DEXTROAMPHET ER 25 MG PO CP24: ORAL_CAPSULE | ORAL | Status: DC

## 2012-01-31 MED ORDER — AZITHROMYCIN 250 MG PO TABS: ORAL_TABLET | ORAL | Status: DC

## 2012-01-31 NOTE — Assessment & Plan Note (Signed)
With recent new fever and length of symptoms.. Will treat for bacterial infection.  Continue treatment for asthma control.. No acute exacerbation at this time.

## 2012-01-31 NOTE — Patient Instructions (Signed)
Prior to getting sick this time of year.. Start antihistamine like claritin or zyrtec and start back to advair regularly. Can consider vit C 500 mg daily as well.  For current illness: use cough suppressant at night, antibiotics x 5 days. Cal if not improving as expected. If wheezing or SOB begins.. Call for steroid pack.

## 2012-01-31 NOTE — Progress Notes (Signed)
  Subjective:    Patient ID: Aaron Tyler, male    DOB: October 24, 1975, 36 y.o.   MRN: [id number]  Cough This is a new problem. The current episode started 1 to 4 weeks ago (Began with cold symptoms during travel. Got a  little better then symtpoms worsened. Ongoing for 4 weeks.). The problem has been waxing and waning. The problem occurs hourly. The cough is productive of sputum (greenish sputum). Associated symptoms include a fever, nasal congestion, rhinorrhea, a sore throat and shortness of breath. Pertinent negatives include no chills, ear congestion, ear pain or headaches. Associated symptoms comments: Mild left sided sore throat... Nasal congestion in beginning has now improved.. Nothing aggravates the symptoms. Risk factors for lung disease include travel. Treatments tried: On advair ( now back on twice daily) but proair not required. Using mucinex.  The treatment provided mild relief. His past medical history is significant for asthma.  Fever  Associated symptoms include coughing and a sore throat. Pertinent negatives include no ear pain or headaches.      Review of Systems  Constitutional: Positive for fever. Negative for chills.  HENT: Positive for sore throat and rhinorrhea. Negative for ear pain.   Respiratory: Positive for cough and shortness of breath.   Neurological: Negative for headaches.       Objective:   Physical Exam  Constitutional: Vital signs are normal. He appears well-developed and well-nourished.  Non-toxic appearance. He does not appear ill. No distress.  HENT:  Head: Normocephalic and atraumatic.  Right Ear: Hearing, tympanic membrane, external ear and ear canal normal. No tenderness. No foreign bodies. Tympanic membrane is not retracted and not bulging.  Left Ear: Hearing, tympanic membrane, external ear and ear canal normal. No tenderness. No foreign bodies. Tympanic membrane is not retracted and not bulging.  Nose: Nose normal. No mucosal edema or rhinorrhea.  Right sinus exhibits no maxillary sinus tenderness and no frontal sinus tenderness. Left sinus exhibits no maxillary sinus tenderness and no frontal sinus tenderness.  Mouth/Throat: Uvula is midline, oropharynx is clear and moist and mucous membranes are normal. Normal dentition. No dental caries. No oropharyngeal exudate or tonsillar abscesses.  Eyes: Conjunctivae normal, EOM and lids are normal. Pupils are equal, round, and reactive to light. No foreign bodies found.  Neck: Trachea normal, normal range of motion and phonation normal. Neck supple. Carotid bruit is not present. No mass and no thyromegaly present.  Cardiovascular: Normal rate, regular rhythm, S1 normal, S2 normal, normal heart sounds, intact distal pulses and normal pulses.  Exam reveals no gallop.   No murmur heard. Pulmonary/Chest: Effort normal and breath sounds normal. No respiratory distress. He has no wheezes. He has no rhonchi. He has no rales.  Abdominal: Soft. Normal appearance and bowel sounds are normal. There is no hepatosplenomegaly. There is no tenderness. There is no rebound, no guarding and no CVA tenderness. No hernia.  Neurological: He is alert. He has normal reflexes.  Skin: Skin is warm, dry and intact. No rash noted.  Psychiatric: He has a normal mood and affect. His speech is normal and behavior is normal. Judgment normal.          Assessment & Plan:

## 2012-03-02 ENCOUNTER — Telehealth: Payer: Self-pay | Admitting: Family Medicine

## 2012-03-02 ENCOUNTER — Other Ambulatory Visit: Payer: BC Managed Care – PPO

## 2012-03-02 DIAGNOSIS — E781 Pure hyperglyceridemia: Secondary | ICD-10-CM

## 2012-03-02 NOTE — Telephone Encounter (Signed)
Message copied by Excell Seltzer on Fri Mar 02, 2012  8:19 AM ------      Message from: Baldomero Lamy      Created: Tue Feb 28, 2012  1:47 PM      Regarding: Cpx labs Fri 1/10       Please order  future cpx labs for pt's upcoming lab appt.      Thanks      Rodney Booze

## 2012-03-09 ENCOUNTER — Encounter: Payer: BC Managed Care – PPO | Admitting: Family Medicine

## 2012-03-20 ENCOUNTER — Telehealth: Payer: Self-pay | Admitting: Family Medicine

## 2012-03-20 NOTE — Telephone Encounter (Signed)
Caller: Laura/Spouse; Phone: [phone]; Reason for Call: Needs renewal of Adderall XR 25mg  1 daily prescription.  States it is time for new Rx.  Last well visit 03/09/12.  Info to office for provider review/Rx/callback.  May reach patient/spouse at [phone] when Rx ready for pick up.  Krs/can

## 2012-03-21 NOTE — Telephone Encounter (Signed)
Print and i will sign

## 2012-03-22 MED ORDER — AMPHETAMINE-DEXTROAMPHET ER 25 MG PO CP24: ORAL_CAPSULE | ORAL | Status: DC

## 2012-03-22 NOTE — Telephone Encounter (Signed)
In your inbox.

## 2012-04-17 ENCOUNTER — Other Ambulatory Visit (INDEPENDENT_AMBULATORY_CARE_PROVIDER_SITE_OTHER): Payer: BC Managed Care – PPO

## 2012-04-17 DIAGNOSIS — E781 Pure hyperglyceridemia: Secondary | ICD-10-CM

## 2012-04-17 LAB — COMPREHENSIVE METABOLIC PANEL
AST: 22 U/L (ref 0–37)
Albumin: 4.3 g/dL (ref 3.5–5.2)
BUN: 20 mg/dL (ref 6–23)
Calcium: 9.2 mg/dL (ref 8.4–10.5)
Chloride: 103 mEq/L (ref 96–112)
Potassium: 4.2 mEq/L (ref 3.5–5.1)
Sodium: 139 mEq/L (ref 135–145)
Total Protein: 7.7 g/dL (ref 6.0–8.3)

## 2012-04-17 LAB — LIPID PANEL
Total CHOL/HDL Ratio: 7
Triglycerides: 274 mg/dL — ABNORMAL HIGH (ref 0.0–149.0)

## 2012-04-17 LAB — LDL CHOLESTEROL, DIRECT: Direct LDL: 167.2 mg/dL

## 2012-04-24 ENCOUNTER — Encounter: Payer: Self-pay | Admitting: Family Medicine

## 2012-04-24 ENCOUNTER — Ambulatory Visit (INDEPENDENT_AMBULATORY_CARE_PROVIDER_SITE_OTHER): Payer: BC Managed Care – PPO | Admitting: Family Medicine

## 2012-04-24 VITALS — BP 124/88 | HR 105 | Temp 98.3°F | Ht 76.0 in | Wt 252.5 lb

## 2012-04-24 DIAGNOSIS — Z0000 Encounter for general adult medical examination without abnormal findings: Secondary | ICD-10-CM

## 2012-04-24 DIAGNOSIS — F411 Generalized anxiety disorder: Secondary | ICD-10-CM | POA: Insufficient documentation

## 2012-04-24 DIAGNOSIS — E781 Pure hyperglyceridemia: Secondary | ICD-10-CM

## 2012-04-24 DIAGNOSIS — M25511 Pain in right shoulder: Secondary | ICD-10-CM

## 2012-04-24 DIAGNOSIS — M25519 Pain in unspecified shoulder: Secondary | ICD-10-CM

## 2012-04-24 DIAGNOSIS — F988 Other specified behavioral and emotional disorders with onset usually occurring in childhood and adolescence: Secondary | ICD-10-CM

## 2012-04-24 DIAGNOSIS — J45909 Unspecified asthma, uncomplicated: Secondary | ICD-10-CM

## 2012-04-24 DIAGNOSIS — R030 Elevated blood-pressure reading, without diagnosis of hypertension: Secondary | ICD-10-CM

## 2012-04-24 MED ORDER — BUPROPION HCL ER (XL) 150 MG PO TB24: 150.0000 mg | ORAL_TABLET | Freq: Every day | ORAL | Status: DC

## 2012-04-24 MED ORDER — AMPHETAMINE-DEXTROAMPHET ER 25 MG PO CP24: ORAL_CAPSULE | ORAL | Status: DC

## 2012-04-24 NOTE — Patient Instructions (Addendum)
Continue meeting with pastor for counseling. Start wellbutrin daily.  Work on regular exercise , healthy eating and weight loss. Increase fish oil to 2000-3000 mg daily. Start stretching exercises for right shoulder. Start diclofenac 75 mg twice a day for 3 days scheduled then as needed. Follow up in 1 month for mood.

## 2012-04-24 NOTE — Progress Notes (Signed)
The patient is here for annual wellness exam and preventative care.   Has been having some right shoulder pain off and on x 1 year. No known injury when playing baseball over past year.  Pain right where clavicle meets shoulder.  Pain is constant, worses when he lies on it at night.  He does pick up child. Most pain reaching above head or picking up child. Has not used any med for pain.  BP: well controlled today.  At home running 110/74- 115/85  ADD: stable control on adderall XR in past .  He has weaned it off  1 month ago.  Still having some slight anxiety  Despite being off  med.   Anxiety: Has ben more stressed out, worried in last 2-3 monhs.  Irritable with temper. No depression. Poor sleep at night.. Some due to right shoulder pain. Looking into B12 complex.  Minimal exercise in winter.    Asthma, moderate persistant: On advair every other day. Has not required albuterol in last 6 -12 months.   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   High triglycerides  Review of Systems  Constitutional: Negative for fever, fatigue and unexpected weight change.  HENT: Negative for ear pain, congestion, sore throat, rhinorrhea, trouble swallowing and postnasal drip.  Eyes: Negative for pain.  Respiratory: Negative for cough, shortness of breath and wheezing.  Cardiovascular: Negative for chest pain, palpitations and leg swelling.  Gastrointestinal: Negative for nausea, abdominal pain, diarrhea, constipation and blood in stool.  Genitourinary: Negative for dysuria, urgency, hematuria, discharge, penile swelling, scrotal swelling, difficulty urinating, penile pain and testicular pain.  No concern for STD testing.  Skin: Negative for rash.  Neurological: Negative for syncope, weakness, light-headedness, numbness and headaches.  Psychiatric/Behavioral: Negative for behavioral problems and dysphoric mood. The patient is not nervous/anxious.  Objective:   Physical Exam  Constitutional: He appears  well-developed and well-nourished. Non-toxic appearance. He does not appear ill. No distress.  HENT:  Head: Normocephalic and atraumatic.  Right Ear: Hearing, tympanic membrane, external ear and ear canal normal.  Left Ear: Hearing, tympanic membrane, external ear and ear canal normal.  Nose: Nose normal.  Mouth/Throat: Uvula is midline, oropharynx is clear and moist and mucous membranes are normal.  Eyes: Conjunctivae, EOM and lids are normal. Pupils are equal, round, and reactive to light. No foreign bodies found.  Neck: Trachea normal, normal range of motion and phonation normal. Neck supple. Carotid bruit is not present. No mass and no thyromegaly present.  Cardiovascular: Normal rate, regular rhythm, S1 normal, S2 normal, intact distal pulses and normal pulses. Exam reveals no gallop.  No murmur heard.  Pulmonary/Chest: Breath sounds normal. He has no wheezes. He has no rhonchi. He has no rales.  Abdominal: Soft. Normal appearance and bowel sounds are normal. There is no hepatosplenomegaly. There is no tenderness. There is no rebound, no guarding and no CVA tenderness. No hernia. Hernia confirmed negative in the right inguinal area and confirmed negative in the left inguinal area.  Genitourinary: Testes normal and penis normal. Right testis shows no mass and no tenderness. Left testis shows no mass and no tenderness. No paraphimosis or penile tenderness.  Lymphadenopathy:  He has no cervical adenopathy.  Right: No inguinal adenopathy present.  Left: No inguinal adenopathy present.  MSK: right shoulder ttp over acromioclavicular joint and subacromial joint, mildly positive cross over, pain with internal roation and mildly positive impingement sign, negative drop arm test. Neurological: He is alert. He has normal strength and normal  reflexes. No cranial nerve deficit or sensory deficit. Gait normal.  Skin: Skin is warm, dry and intact. No rash noted.  Psychiatric: He has a slightly anxious  mood,nml affect. His speech is mildly pressured and behavior is normal. Judgment normal.  Assessment & Plan:   CPX: The patient's preventative maintenance and recommended screening tests for an annual wellness exam were reviewed in full today.  Brought up to date unless services declined.  Counselled on the importance of diet, exercise, and its role in overall health and mortality.  The patient's FH and SH was reviewed, including their home life, tobacco status, and drug and alcohol status.   Vaccines: Td, PNA uptodate.

## 2012-04-24 NOTE — Progress Notes (Deleted)
  Subjective:    Patient ID: Aaron Tyler, male    DOB: 18-Feb-1976, 37 y.o.   MRN: [id number]  HPI  The patient is here for annual wellness exam and preventative care.    ADD:  Moderate persistant asthma   Review of Systems     Objective:   Physical Exam        Assessment & Plan:

## 2012-04-27 DIAGNOSIS — M25511 Pain in right shoulder: Secondary | ICD-10-CM | POA: Insufficient documentation

## 2012-04-27 NOTE — Assessment & Plan Note (Addendum)
Poor control. Start wellutrin given low level of sexual dysfunction SE.  Offered conseling. Aaron Tyler plans to continue [employer] streamer.  Close follow up in 1 month,.

## 2012-04-27 NOTE — Assessment & Plan Note (Signed)
Encouraged exercise, weight loss, healthy eating habits. Follow.

## 2012-04-27 NOTE — Assessment & Plan Note (Signed)
INcrease fish oil. Work on lifestyle changes.

## 2012-04-27 NOTE — Assessment & Plan Note (Signed)
Start stretching exercises for right shoulder. Info given. Start diclofenac 75 mg twice a day for 3 days scheduled then as needed.

## 2012-04-27 NOTE — Assessment & Plan Note (Signed)
To restart med same dose.Refilled medication as needed.

## 2012-05-25 ENCOUNTER — Encounter: Payer: Self-pay | Admitting: Family Medicine

## 2012-05-25 ENCOUNTER — Ambulatory Visit (INDEPENDENT_AMBULATORY_CARE_PROVIDER_SITE_OTHER): Payer: BC Managed Care – PPO | Admitting: Family Medicine

## 2012-05-25 VITALS — BP 124/100 | HR 88 | Temp 98.1°F | Wt 246.0 lb

## 2012-05-25 DIAGNOSIS — R030 Elevated blood-pressure reading, without diagnosis of hypertension: Secondary | ICD-10-CM

## 2012-05-25 DIAGNOSIS — F411 Generalized anxiety disorder: Secondary | ICD-10-CM

## 2012-05-25 DIAGNOSIS — M25519 Pain in unspecified shoulder: Secondary | ICD-10-CM

## 2012-05-25 DIAGNOSIS — M25511 Pain in right shoulder: Secondary | ICD-10-CM

## 2012-05-25 MED ORDER — AMPHETAMINE-DEXTROAMPHET ER 25 MG PO CP24: ORAL_CAPSULE | ORAL | Status: DC

## 2012-05-25 MED ORDER — BUPROPION HCL ER (XL) 150 MG PO TB24: 150.0000 mg | ORAL_TABLET | Freq: Every day | ORAL | Status: DC

## 2012-05-25 MED ORDER — DICLOFENAC SODIUM 75 MG PO TBEC: 75.0000 mg | DELAYED_RELEASE_TABLET | Freq: Two times a day (BID) | ORAL | Status: DC

## 2012-05-25 NOTE — Assessment & Plan Note (Signed)
Continue to follow. Hopefully further improvement in mood will improve BP control. Encouraged exercise, weight loss, healthy eating habits.

## 2012-05-25 NOTE — Assessment & Plan Note (Signed)
Minimal improvement on wellbutroin 150 mg daily... Will give this more time. If not further improvement in next 2-3 weeks... Will increase to 200 Mg SR BID or 300 mg XL daily

## 2012-05-25 NOTE — Assessment & Plan Note (Signed)
Diclofenac prn, continue stretching exercises.

## 2012-05-25 NOTE — Patient Instructions (Addendum)
Call or send email about how much improvement you are having in 2-3 week. Continue current medications. Use diclofenac as needed for pain in right choulder. Continue home stretching exercsies. Occasionally check blood pressure at home... Goal is <140/90.

## 2012-05-25 NOTE — Addendum Note (Signed)
Addended by: Kerby Nora E on: 05/25/2012 09:19 AM   Modules accepted: Orders

## 2012-05-25 NOTE — Addendum Note (Signed)
Addended by: Eliezer Bottom on: 05/25/2012 09:18 AM   Modules accepted: Orders

## 2012-05-25 NOTE — Progress Notes (Signed)
  Subjective:    Patient ID: Aaron Tyler, male    DOB: Feb 20, 1976, 37 y.o.   MRN: [id number]  HPI  37 year old male presents for 1 month follow up of generalized anxiety.  Anxiety: Started on wellbutrin 1 month ago.  Initially has some HA as SE.  He has been seeing his pastor for counseling.  He has noted some improvement win anxiety. He has noted about 20% improvement.  Sleeping better.  Taking allegra D for allergies. Has helped headaches also.   Right shoulder pain: Treated with diclofenac and stretching exercises. Improved. Sleeping better at noght.   ADD: Now back on previous dose adderall. In past her has had elevated BP without dx of HTN: Today BP initially up, last few OV it was in nml range. BP Readings from Last 3 Encounters:  05/25/12 124/100  04/24/12 124/88  01/31/12 120/84  He is upset today about a deal and feels stressed about this today.  He has been exercising and eating right.[name] ahs lost 8 lbs. Wt Readings from Last 3 Encounters:  05/25/12 246 lb (111.585 kg)  04/24/12 252 lb 8 oz (114.533 kg)  01/31/12 237 lb (107.502 kg)     Review of Systems  Constitutional: Negative for fever and fatigue.  HENT: Negative for ear pain.   Eyes: Negative for pain.  Respiratory: Negative for shortness of breath.   Cardiovascular: Negative for chest pain.  Gastrointestinal: Negative for abdominal pain.  Psychiatric/Behavioral: Negative for suicidal ideas and sleep disturbance.       Objective:   Physical Exam  Constitutional: Vital signs are normal. He appears well-developed and well-nourished.  HENT:  Head: Normocephalic.  Right Ear: Hearing normal.  Left Ear: Hearing normal.  Nose: Nose normal.  Mouth/Throat: Oropharynx is clear and moist and mucous membranes are normal.  Neck: Trachea normal. Carotid bruit is not present. No mass and no thyromegaly present.  Cardiovascular: Normal rate, regular rhythm and normal pulses.  Exam reveals no gallop, no distant  heart sounds and no friction rub.   No murmur heard. No peripheral edema  Pulmonary/Chest: Effort normal and breath sounds normal. No respiratory distress.  Skin: Skin is warm, dry and intact. No rash noted.  Psychiatric: He has a normal mood and affect. His speech is normal and behavior is normal. Thought content normal.          Assessment & Plan:

## 2012-06-26 ENCOUNTER — Other Ambulatory Visit: Payer: Self-pay | Admitting: *Deleted

## 2012-06-26 MED ORDER — BUPROPION HCL ER (XL) 150 MG PO TB24: 150.0000 mg | ORAL_TABLET | Freq: Every day | ORAL | Status: DC

## 2012-08-08 ENCOUNTER — Encounter: Payer: Self-pay | Admitting: *Deleted

## 2012-08-09 ENCOUNTER — Encounter: Payer: Self-pay | Admitting: Family Medicine

## 2012-08-09 ENCOUNTER — Ambulatory Visit (INDEPENDENT_AMBULATORY_CARE_PROVIDER_SITE_OTHER): Payer: BC Managed Care – PPO | Admitting: Family Medicine

## 2012-08-09 VITALS — BP 136/88 | HR 97 | Temp 98.0°F | Wt 256.5 lb

## 2012-08-09 DIAGNOSIS — J019 Acute sinusitis, unspecified: Secondary | ICD-10-CM

## 2012-08-09 MED ORDER — AMOXICILLIN 500 MG PO CAPS: 1000.0000 mg | ORAL_CAPSULE | Freq: Two times a day (BID) | ORAL | Status: DC

## 2012-08-09 NOTE — Patient Instructions (Addendum)
Start and complete antibiotics... Amoxicillin x 10 days. Continue nasonex, zyrtec D, can add mucinex to break up mucus. Nasal saline irrigation. Call if not improving as expected.

## 2012-08-09 NOTE — Assessment & Plan Note (Signed)
>   2 weeks of symptoms. Treat with mucinex , antihistamine irrigation and antibiotics.

## 2012-08-09 NOTE — Progress Notes (Signed)
  Subjective:    Patient ID: Aaron Tyler, male    DOB: 02/20/1976, 37 y.o.   MRN: [id number]  Sinusitis This is a new problem. The current episode started 1 to 4 weeks ago. The problem has been gradually worsening since onset. There has been no fever. The pain is severe. Associated symptoms include congestion, headaches and sinus pressure. Pertinent negatives include no coughing, ear pain, shortness of breath, sneezing or sore throat. (Thick, dark yellow, green mucus) Treatments tried: wearing mask outside, using advair, zyrtrec D, nasonex. The treatment provided mild relief.   Headaches: he has weaned himself off addreal given was contributing to headhaches.   Asthma, well controlled on advair.   Review of Systems  Constitutional: Negative for fever and fatigue.  HENT: Positive for congestion and sinus pressure. Negative for ear pain, sore throat and sneezing.   Eyes: Negative for pain.  Respiratory: Negative for cough and shortness of breath.   Cardiovascular: Negative for chest pain.  Gastrointestinal: Negative for abdominal pain.  Neurological: Positive for headaches.       Objective:   Physical Exam  Constitutional: Vital signs are normal. He appears well-developed and well-nourished.  Non-toxic appearance. He does not appear ill. No distress.  HENT:  Head: Normocephalic and atraumatic.  Right Ear: Hearing, tympanic membrane, external ear and ear canal normal. No tenderness. No foreign bodies. Tympanic membrane is not retracted and not bulging.  Left Ear: Hearing, tympanic membrane, external ear and ear canal normal. No tenderness. No foreign bodies. Tympanic membrane is not retracted and not bulging.  Nose: No mucosal edema or rhinorrhea. Right sinus exhibits maxillary sinus tenderness. Right sinus exhibits no frontal sinus tenderness. Left sinus exhibits maxillary sinus tenderness. Left sinus exhibits no frontal sinus tenderness.  Mouth/Throat: Uvula is midline, oropharynx is  clear and moist and mucous membranes are normal. Normal dentition. No dental caries. No oropharyngeal exudate or tonsillar abscesses.  Eyes: Conjunctivae, EOM and lids are normal. Pupils are equal, round, and reactive to light. No foreign bodies found.  Neck: Trachea normal, normal range of motion and phonation normal. Neck supple. Carotid bruit is not present. No mass and no thyromegaly present.  Cardiovascular: Normal rate, regular rhythm, S1 normal, S2 normal, normal heart sounds, intact distal pulses and normal pulses.  Exam reveals no gallop.   No murmur heard. Pulmonary/Chest: Effort normal and breath sounds normal. No respiratory distress. He has no wheezes. He has no rhonchi. He has no rales.  Abdominal: Soft. Normal appearance and bowel sounds are normal. There is no hepatosplenomegaly. There is no tenderness. There is no rebound, no guarding and no CVA tenderness. No hernia.  Neurological: He is alert. He has normal reflexes.  Skin: Skin is warm, dry and intact. No rash noted.  Psychiatric: He has a normal mood and affect. His speech is normal and behavior is normal. Judgment normal.          Assessment & Plan:

## 2012-09-12 ENCOUNTER — Other Ambulatory Visit: Payer: Self-pay | Admitting: Family Medicine

## 2012-09-18 ENCOUNTER — Other Ambulatory Visit: Payer: Self-pay | Admitting: Family Medicine

## 2012-09-18 ENCOUNTER — Telehealth: Payer: Self-pay | Admitting: Family Medicine

## 2012-09-18 NOTE — Telephone Encounter (Signed)
[facility] requesting alternative medication to Advair.  Form placed in inbox.

## 2012-09-24 NOTE — Telephone Encounter (Signed)
[facility] with [facility] said have not received form back for alternative med for Advair. Fax form to [phone].

## 2012-09-25 NOTE — Telephone Encounter (Signed)
This was completed a long time ago. Please locate.

## 2012-09-25 NOTE — Telephone Encounter (Signed)
Form was scanned in chart, form was faxed 09/18/2012 by Lyla Son, I will refax

## 2012-10-08 ENCOUNTER — Telehealth: Payer: Self-pay

## 2012-10-08 NOTE — Telephone Encounter (Signed)
Pts wife request PA for Advair; advised wife to contact pharmacy and have them fax PA form and we will start PA process. Mrs meulemans voiced understanding.

## 2012-10-09 NOTE — Telephone Encounter (Signed)
Mrs Mollenkopf left v/m requesting our office to call [phone]. Called this # requesting PA form was given prerecorded specials for pt's over 50 and then if denied needs to give another toll free #. Hung up and called pt's wife and asked if she would [facility] and request PA for Advair sent to office; Mrs Coakley voiced understanding.

## 2012-10-11 NOTE — Telephone Encounter (Signed)
Mrs Drees called back and I have to call [phone] since pt has not tried [place] or [facility] not [facility] for Adv[facility]; pt [facility] not [facility]. [facility] and above info is true; Darel Hong could not tell if received 08/29/12 fax for [facility]. [facility] (scanned under media tab) to [phone]. Mrs Cassin advised.

## 2012-10-19 ENCOUNTER — Other Ambulatory Visit: Payer: Self-pay | Admitting: Family Medicine

## 2012-10-26 ENCOUNTER — Ambulatory Visit (INDEPENDENT_AMBULATORY_CARE_PROVIDER_SITE_OTHER): Payer: BC Managed Care – PPO | Admitting: Family Medicine

## 2012-10-26 ENCOUNTER — Encounter: Payer: Self-pay | Admitting: Family Medicine

## 2012-10-26 VITALS — BP 120/86 | HR 79 | Temp 98.1°F | Ht 76.0 in | Wt 255.5 lb

## 2012-10-26 DIAGNOSIS — M25519 Pain in unspecified shoulder: Secondary | ICD-10-CM

## 2012-10-26 DIAGNOSIS — J309 Allergic rhinitis, unspecified: Secondary | ICD-10-CM

## 2012-10-26 DIAGNOSIS — F411 Generalized anxiety disorder: Secondary | ICD-10-CM

## 2012-10-26 DIAGNOSIS — J45909 Unspecified asthma, uncomplicated: Secondary | ICD-10-CM

## 2012-10-26 DIAGNOSIS — M25511 Pain in right shoulder: Secondary | ICD-10-CM

## 2012-10-26 MED ORDER — ALBUTEROL SULFATE HFA 108 (90 BASE) MCG/ACT IN AERS: 2.0000 | INHALATION_SPRAY | Freq: Four times a day (QID) | RESPIRATORY_TRACT | Status: DC | PRN

## 2012-10-26 MED ORDER — FLUTICASONE-SALMETEROL 100-50 MCG/DOSE IN AEPB: 1.0000 | INHALATION_SPRAY | Freq: Every day | RESPIRATORY_TRACT | Status: DC

## 2012-10-26 NOTE — Assessment & Plan Note (Signed)
Dulera not as helpful as advair... Will change back.

## 2012-10-26 NOTE — Patient Instructions (Addendum)
Decrease wellbutrin to every other day for 1-2 weeks, if doing well , can stay at that dose or decrease further to 1 tab ever 2 days x 1 week then off.  Continue current medications and saline irrigation. Call if allergies worsen or if not improved off wellbutrin. Can try Singulair.  Make appt to see Dr. Patsy Lager for shoulder pain.  Return for CPX with labs prior in 04/2013.

## 2012-10-26 NOTE — Assessment & Plan Note (Signed)
Minimal improvement with NSAIDs.  he did have Rheum eval last year... Neg. Refer to sports medicine.

## 2012-10-26 NOTE — Assessment & Plan Note (Signed)
Stable control. Wean off wellbutrin... May make headache and taste issues resolve. If not consider allergies as cause.

## 2012-10-26 NOTE — Progress Notes (Signed)
  Subjective:    Patient ID: Aaron Tyler, male    DOB: 04-19-1975, 37 y.o.   MRN: [id number]  HPI   37 year old male presents for follow up.   ADD:  On no medication currently. Doing well off the medication.  Anxiety: On wellbutrin XL. Stable control overall, but since he is doing well now and would like to wean off at this point. No associated SE.  BP Readings from Last 3 Encounters:  10/26/12 120/86  08/09/12 136/88  05/25/12 124/100   He has history of  frequent sinus meds. Sinus issues in last several months. Nasocort, mucinex, irrigation and zyrtec for allergies. Still with decreased smell, and headache towards end of day. No further sticky gluey green mucus after antibiotics.  Moderate persistant asthma... No issues, no need for rescue inhalers.  Elwin Sleight does not work as well as Designer, industrial/product.  He has tried it for 1 month.  He continues with shoulder pain at night.  Diclofenac hasn't help. Review of Systems  Constitutional: Negative for fever and fatigue.  HENT: Negative for ear pain and nosebleeds.   Eyes: Negative for pain and redness.  Respiratory: Negative for cough and shortness of breath.   Cardiovascular: Negative for chest pain, palpitations and leg swelling.  Gastrointestinal: Negative for abdominal pain.       Objective:   Physical Exam  Constitutional: Vital signs are normal. He appears well-developed and well-nourished.  HENT:  Head: Normocephalic.  Right Ear: Hearing, tympanic membrane, external ear and ear canal normal.  Left Ear: Hearing, tympanic membrane, external ear and ear canal normal.  Nose: Nose normal. Right sinus exhibits no maxillary sinus tenderness and no frontal sinus tenderness. Left sinus exhibits no maxillary sinus tenderness and no frontal sinus tenderness.  Mouth/Throat: Oropharynx is clear and moist and mucous membranes are normal.  Neck: Trachea normal. Carotid bruit is not present. No mass and no thyromegaly present.  Cardiovascular:  Normal rate, regular rhythm and normal pulses.  Exam reveals no gallop, no distant heart sounds and no friction rub.   No murmur heard. No peripheral edema  Pulmonary/Chest: Effort normal and breath sounds normal. No respiratory distress.  Skin: Skin is warm, dry and intact. No rash noted.  Psychiatric: He has a normal mood and affect. His speech is normal and behavior is normal. Thought content normal.          Assessment & Plan:

## 2012-10-26 NOTE — Assessment & Plan Note (Signed)
MAy need to try singulair for symptoms if not improving or worsening during fall allergies.

## 2012-10-29 ENCOUNTER — Telehealth: Payer: Self-pay

## 2012-10-29 NOTE — Telephone Encounter (Signed)
Received fax from [facility] for Covered Alternatives Request for Advair.  Requesting to change to [facility] or Symbicort.  Form placed in Dr. Daphine Deutscher in box for review.

## 2012-10-29 NOTE — Telephone Encounter (Signed)
[facility] left [facility]m requesting clarification of maximum daily dosage of Advair 100-50. Spoke with Thayer Ohm pharmacist [facility]. Usual customary dose is one puff twice a day. Should pt do one puff daily or twice a day. Chris request cb. Ref # [id number].

## 2012-10-30 NOTE — Telephone Encounter (Signed)
Pt has tried dulera without benefit x 1 month.. Please let them know... Can he now use Advair?

## 2012-10-30 NOTE — Telephone Encounter (Signed)
Spoke with Pharmacist at [facility] and advised per Dr. Ermalene Searing to keep dosage at 1 puff into lungs daily.

## 2012-10-30 NOTE — Telephone Encounter (Signed)
[facility] from [phone] left v/m requesting call back for clarification of dosing issue for Advair. Ref # [id number].

## 2012-11-05 ENCOUNTER — Telehealth: Payer: Self-pay

## 2012-11-05 NOTE — Telephone Encounter (Signed)
A[facility] with [facility] left v/m requesting cb re: clarification of Advair, ref # [id number]. Spoke with pharmacist, Coralie Common. Advair is not covered by pts insurance, covered alternatives are Symbicort or Dulera. Pharmacist wants to know if can prescribe alternative or does Dr Ermalene Searing request coverage review at [phone] option 2; member # is [id number].Please advise.

## 2012-11-06 MED ORDER — BUDESONIDE-FORMOTEROL FUMARATE 160-4.5 MCG/ACT IN AERO: 2.0000 | INHALATION_SPRAY | Freq: Two times a day (BID) | RESPIRATORY_TRACT | Status: DC

## 2012-11-06 NOTE — Telephone Encounter (Signed)
Notify pt of this. He has tried [place] without success. Would he like to try symbicort. It looks like insurance does not cover advair AT ALL.

## 2012-11-06 NOTE — Telephone Encounter (Signed)
Spoke with Patient.  He is willing to try Symbicort but would like only one sent in to [facility] [address] for trial purposes.  If Symbicort works well for patient then we can send in three month supply to [facility].  Aaron Tyler

## 2012-11-06 NOTE — Telephone Encounter (Signed)
Left message for patient to return my call.

## 2012-11-08 ENCOUNTER — Telehealth: Payer: Self-pay | Admitting: *Deleted

## 2012-11-08 NOTE — Telephone Encounter (Signed)
Received fax from [facility] asking Dr. Ermalene Searing to change prescription for Proventil 90 mcg inhaler to Proair HFA 8.5 gm.  Will forward to Dr. Ermalene Searing for review.  Ileana Ladd

## 2012-11-09 MED ORDER — ALBUTEROL SULFATE HFA 108 (90 BASE) MCG/ACT IN AERS: 2.0000 | INHALATION_SPRAY | Freq: Four times a day (QID) | RESPIRATORY_TRACT | Status: DC | PRN

## 2012-11-09 NOTE — Telephone Encounter (Signed)
Okay to make change as requested. 

## 2012-11-09 NOTE — Addendum Note (Signed)
Addended by: Damita Lack on: 11/09/2012 08:37 AM   Modules accepted: Orders

## 2012-11-09 NOTE — Telephone Encounter (Signed)
Prescription sent to [facility].

## 2012-12-04 ENCOUNTER — Other Ambulatory Visit: Payer: Self-pay | Admitting: Family Medicine

## 2013-01-25 NOTE — Telephone Encounter (Signed)
Pt left v/m that pt has tried both approved meds that his insurance will pay for; Symbicort and Elwin Sleight are not effective for pt. Pt wants to see if Dr Ermalene Searing can get Advair thru ins. See 11/05/12 note.Since the two approved meds don't work for pt. Pt request [facility]; pt request cb if can get Advair.

## 2013-01-30 ENCOUNTER — Encounter: Payer: Self-pay | Admitting: Family Medicine

## 2013-01-30 ENCOUNTER — Ambulatory Visit (INDEPENDENT_AMBULATORY_CARE_PROVIDER_SITE_OTHER): Payer: BC Managed Care – PPO | Admitting: Family Medicine

## 2013-01-30 VITALS — BP 132/80 | HR 84 | Temp 98.0°F | Ht 75.0 in | Wt 253.8 lb

## 2013-01-30 DIAGNOSIS — M7551 Bursitis of right shoulder: Secondary | ICD-10-CM

## 2013-01-30 DIAGNOSIS — M75100 Unspecified rotator cuff tear or rupture of unspecified shoulder, not specified as traumatic: Secondary | ICD-10-CM

## 2013-01-30 DIAGNOSIS — M7581 Other shoulder lesions, right shoulder: Secondary | ICD-10-CM

## 2013-01-30 DIAGNOSIS — M67919 Unspecified disorder of synovium and tendon, unspecified shoulder: Secondary | ICD-10-CM

## 2013-01-30 NOTE — Progress Notes (Signed)
Date:  01/30/2013   Name:  Aaron Tyler   DOB:  [birth year]   MRN:  [id number] Gender: male Age: 37 y.o.  PCP:  Kerby Nora, MD  Evaluating MD: Hannah Beat, MD   The patient noted above presents with shoulder pain that has been ongoing for a year or so, but intermittent over 3-4 years. there is no history of trauma or accident recently. Felt something when throwing a baseball some time ago. The patient denies neck pain or radicular symptoms. Denies dislocation, subluxation, separation of the shoulder. The patient does complain of pain in the overhead plane with significant painful arc of motion.  Right shoulder, some movements are really painful. Sleeping on one side. ABduction and stretched out under the pillow, over the last year. When picking up kids, it will hurt.  22 and 89 year old.   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  Has had plenty of xrays.  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  Over 3-4 years it has gotten worse and worse.   Last year did some rehab.  Went to PT last year.    Medications Tried: Tylenol, NSAIDS Ice or Heat: minimally helpful Tried PT: yes  Prior shoulder Injury: No Prior surgery: No Prior fracture: No  The PMH, PSH, Social History, Family History, Medications, and allergies have been reviewed in [facility], and have been updated if relevant.  REVIEW OF SYSTEMS  GEN: No fevers, chills. Nontoxic. Primarily MSK c/o today. MSK: Detailed in the HPI GI: tolerating PO intake without difficulty Neuro: No numbness, parasthesias, or tingling associated. Otherwise the pertinent positives of the ROS are noted above.   PHYSICAL EXAM  Blood pressure 132/80, pulse 84, temperature 98 F (36.7 C), temperature source Oral, height 6\' 3"  (1.905 m), weight 253 lb 12.8 oz (115.123 kg), SpO2 96.00%.  GEN: Well-developed,well-nourished,in no acute distress; alert,appropriate and  cooperative throughout examination HEENT: Normocephalic and atraumatic without obvious abnormalities. Ears, externally no deformities PULM: Breathing comfortably in no respiratory distress EXT: No clubbing, cyanosis, or edema PSYCH: Normally interactive. Cooperative during the interview. Pleasant. Friendly and conversant. Not anxious or depressed appearing. Normal, full affect.  Shoulder: R Inspection: No muscle wasting or winging Ecchymosis/edema: neg  AC joint, scapula, clavicle: NT Cervical spine: NT, full ROM Spurling's: neg Abduction: full, 5/5 Flexion: full, 5/5 IR, full, lift-off: 5/5 ER at neutral: full, 5/5 AC crossover: neg Neer: pos Hawkins: pos Drop Test: neg Empty Can: +/- Supraspinatus insertion: mild-mod T Bicipital groove: NT Speed's: neg Yergason's: neg O"BRIEN"S IS POSITIVE ON THE RIGHT Sulcus sign: neg Scapular dyskinesis: none C5-T1 intact  Neuro: Sensation intact Grip 5/5     Rotator cuff tendonitis, right  Subacromial bursitis, right  1. Rotator Cuff tendinopathy: >25 minutes spent in face to face time with patient, >50% spent in counselling or coordination of care  Rotator cuff strengthening and scapular stabilization exercises were reviewed with the patient.  Vanderbilt. RTC and scapular stabilization program given to the patient. Retraining shoulder mechanics and function was emphasized to the patient with rehab done at least 5-6 days a week.  Cont motrin 800 mg po tid prn  Recheck 2-3 mo.  Cannot exclude labral tear.  Patient Instructions  Advice about weightlifting with SHOULDER PROBLEMS:  1. Avoid overhead presses, military presses, clean and jerk, snatch, heavy incline bench presses, heavy bench presses, any kind of chest fly. Heavy lateral raises will hurt.  ALL OF THESE MAKE THE ROTATOR CUFF IMPINGE ON THE BONY ANATOMY ABOVE THEM AND OFTEN CAUSE RECURRENT BURSITIS AND ROTATOR CUFF TENDINITIS.  2. If you have to do bench presses  then I would do the decline press and not lock out at the end of the lift. DUMBBELLS WILL TAKE STRESS OFF THE SHOULDER, SO I RECOMMEND SWITCHING FROM BAR PRESSES TO DUMBBELLS.  Continue with rotator cuff strengthening and scapular stabilizaton. Recommended that you could do all aerobic actvity.  3. All pulls, lat pull downs, pull-ups, mid-back low back, arms should be no problem. 4. Core is fine    Orders Today:  No orders of the defined types were placed in this encounter.    New medications, updates to list, dose adjustments: No orders of the defined types were placed in this encounter.    Signed,  Elpidio Galea. Isacc Turney, MD, CAQ Sports Medicine  [facility] [address] [place] [address] Phone: [phone] Fax: [phone]  Updated Complete Medication List:   Medication List       This list is accurate as of: 01/30/13  3:25 PM.  Always use your most recent med list.               albuterol 108 (90 BASE) MCG/ACT inhaler  Commonly known as:  PROAIR HFA  Inhale 2 puffs into the lungs every 6 (six) hours as needed for wheezing.     Fluticasone-Salmeterol 100-50 MCG/DOSE Aepb  Commonly known as:  ADVAIR  Inhale 1 puff into the lungs daily.

## 2013-01-30 NOTE — Progress Notes (Signed)
Pre-visit discussion using our clinic review tool. No additional management support is needed unless otherwise documented below in the visit note.  

## 2013-01-30 NOTE — Patient Instructions (Signed)
Advice about weightlifting with SHOULDER PROBLEMS:  1. Avoid overhead presses, military presses, clean and jerk, snatch, heavy incline bench presses, heavy bench presses, any kind of chest fly. Heavy lateral raises will hurt.  ALL OF THESE MAKE THE ROTATOR CUFF IMPINGE ON THE BONY ANATOMY ABOVE THEM AND OFTEN CAUSE RECURRENT BURSITIS AND ROTATOR CUFF TENDINITIS.  2. If you have to do bench presses then I would do the decline press and not lock out at the end of the lift. DUMBBELLS WILL TAKE STRESS OFF THE SHOULDER, SO I RECOMMEND SWITCHING FROM BAR PRESSES TO DUMBBELLS.  Continue with rotator cuff strengthening and scapular stabilizaton. Recommended that you could do all aerobic actvity.  3. All pulls, lat pull downs, pull-ups, mid-back low back, arms should be no problem. 4. Core is fine  

## 2013-01-31 ENCOUNTER — Other Ambulatory Visit: Payer: Self-pay | Admitting: Family Medicine

## 2013-01-31 NOTE — Telephone Encounter (Signed)
Pt was checking the status of his PA, I advise pt that Rodney Booze is working on PA and gave him samples of Advair until we hear back on the [place]

## 2013-01-31 NOTE — Telephone Encounter (Signed)
Prior [facility]

## 2013-02-12 ENCOUNTER — Telehealth: Payer: Self-pay | Admitting: *Deleted

## 2013-02-12 ENCOUNTER — Other Ambulatory Visit: Payer: Self-pay | Admitting: Family Medicine

## 2013-02-12 MED ORDER — TRAMADOL HCL 50 MG PO TABS: 50.0000 mg | ORAL_TABLET | Freq: Four times a day (QID) | ORAL | Status: DC | PRN

## 2013-02-12 NOTE — Telephone Encounter (Signed)
[facility] [address] 

## 2013-02-12 NOTE — Telephone Encounter (Signed)
Left message for Gregary on his cell phone that prescription has been sent to [facility]

## 2013-02-12 NOTE — Telephone Encounter (Signed)
Aaron Tyler is requesting something for his shoulder pain.  States he mostly needs something to take at night time.  Is currently taking Ibuprofen with little relief.  Please advise.

## 2013-02-12 NOTE — Telephone Encounter (Signed)
Tramadol 50 mg, 1 po q 6 hours prn pain, #50, 2 refills  

## 2013-02-18 ENCOUNTER — Telehealth: Payer: Self-pay | Admitting: *Deleted

## 2013-02-18 MED ORDER — FLUTICASONE-SALMETEROL 100-50 MCG/DOSE IN AEPB: 1.0000 | INHALATION_SPRAY | Freq: Every day | RESPIRATORY_TRACT | Status: DC

## 2013-02-18 NOTE — Telephone Encounter (Signed)
Recieved prior auth request from pharmacy for .  I obtained authorization  from insurance, and I notified pharmacy of approval via fax. Waiting on approval notice from insurance company to arrive via fax. Once forms are received I will send forms to be signed by provider then scanned into patients medical record.   Approval form received by insurance company. Will send to provider for signature, then to be scanned into patients medical record.  

## 2013-02-18 NOTE — Telephone Encounter (Signed)
Approval form received by insurance company for Advair. Will send to provider for signature, then to be scanned into patients medical record.

## 2013-02-18 NOTE — Addendum Note (Signed)
Addended by: Baldomero Lamy on: 02/18/2013 09:31 AM   Modules accepted: Orders

## 2013-05-09 ENCOUNTER — Other Ambulatory Visit: Payer: Self-pay | Admitting: Family Medicine

## 2013-05-09 NOTE — Telephone Encounter (Signed)
Electronic refill request, please advise  

## 2013-05-10 NOTE — Telephone Encounter (Signed)
If not improving.. Needs follow up with Dr. Patsy Lageropland. Will refill once.

## 2013-05-10 NOTE — Telephone Encounter (Signed)
Rx called in as prescribed  Left voicemail letting pt know to f/u with Dr. Patsy Lageropland if no improvement

## 2013-07-24 ENCOUNTER — Ambulatory Visit: Payer: BC Managed Care – PPO | Admitting: Internal Medicine

## 2013-07-26 ENCOUNTER — Ambulatory Visit (INDEPENDENT_AMBULATORY_CARE_PROVIDER_SITE_OTHER): Payer: 59 | Admitting: Family Medicine

## 2013-07-26 ENCOUNTER — Encounter: Payer: Self-pay | Admitting: Family Medicine

## 2013-07-26 VITALS — BP 130/100 | HR 96 | Temp 97.6°F | Ht 75.0 in | Wt 255.5 lb

## 2013-07-26 DIAGNOSIS — T148XXA Other injury of unspecified body region, initial encounter: Secondary | ICD-10-CM

## 2013-07-26 DIAGNOSIS — R1011 Right upper quadrant pain: Secondary | ICD-10-CM

## 2013-07-26 DIAGNOSIS — R1310 Dysphagia, unspecified: Secondary | ICD-10-CM | POA: Insufficient documentation

## 2013-07-26 NOTE — Patient Instructions (Addendum)
Complete request of results for GI eval in [place]. Call if recurrent right upper quadrant abdominal pain after meals. Avoid pressure on thumbs, wrist.  Look into ergonomics at work.

## 2013-07-26 NOTE — Progress Notes (Signed)
   Subjective:    Patient ID: Aaron Tyler, male    DOB: [birth year], 38 y.o.   MRN: [id number]  HPI 38 year old male present with new onset pain in epigastrum and RUQ, burning, 3 days ago. Sharp a times. Lasted 2-3 seconds off and on,  Occurred after eating roast beef, cheese, ranch. Associated with nausea.  No V/ D.  That went away after 24 hours.   Did not take any thing for it.   Had EGD in [place], told reflux. Resolved symptoms.  In last few months now he has had recurrent choking with particular foods. Occurred about >30 times in last year. If he avoids particular food it does not happen. Seems worse if eating fast or if stressed.   Numbness/tingling in B thumbs when presses on thenar eminence. Lots of typing andpoor ergonomic.   Review of Systems  Constitutional: Negative for fever and fatigue.  HENT: Negative for ear pain.   Eyes: Negative for pain.  Respiratory: Negative for cough and shortness of breath.   Cardiovascular: Negative for chest pain.       Objective:   Physical Exam  Constitutional: Vital signs are normal. He appears well-developed and well-nourished.  HENT:  Head: Normocephalic.  Right Ear: Hearing normal.  Left Ear: Hearing normal.  Nose: Nose normal.  Mouth/Throat: Oropharynx is clear and moist and mucous membranes are normal.  Neck: Trachea normal. Carotid bruit is not present. No mass and no thyromegaly present.  Cardiovascular: Normal rate, regular rhythm and normal pulses.  Exam reveals no gallop, no distant heart sounds and no friction rub.   No murmur heard. No peripheral edema  Pulmonary/Chest: Effort normal and breath sounds normal. No respiratory distress.  Abdominal: He exhibits no distension and no ascites. There is no tenderness.  Neurological:  Neg tinel and pahlen.  numbness in thumb with pressure on B thenar eminence.  Skin: Skin is warm, dry and intact. No rash noted.  Psychiatric: He has a normal mood and affect. His  speech is normal and behavior is normal. Thought content normal.          Assessment & Plan:

## 2013-07-26 NOTE — Progress Notes (Signed)
Pre visit review using our clinic review tool, if applicable. No additional management support is needed unless otherwise documented below in the visit note. 

## 2013-08-13 DIAGNOSIS — T148XXA Other injury of unspecified body region, initial encounter: Secondary | ICD-10-CM | POA: Insufficient documentation

## 2013-08-13 DIAGNOSIS — R1310 Dysphagia, unspecified: Secondary | ICD-10-CM | POA: Insufficient documentation

## 2013-08-13 DIAGNOSIS — R1011 Right upper quadrant pain: Secondary | ICD-10-CM | POA: Insufficient documentation

## 2013-08-13 NOTE — Assessment & Plan Note (Signed)
Avoid pressure on thumbs, wrist.  Look into ergonomics at work.

## 2013-08-13 NOTE — Assessment & Plan Note (Signed)
Resolved now.  ? Gastritis vs GERD vs other Call if recurs.  Avoid triggers of GERD.

## 2013-08-13 NOTE — Assessment & Plan Note (Signed)
?   Past EGD results to eval same issue. Will get records. May need repeat EGD for eval or swallowing study.

## 2013-09-18 ENCOUNTER — Telehealth: Payer: Self-pay | Admitting: Family Medicine

## 2013-09-18 NOTE — Telephone Encounter (Signed)
i am ok with this.  Prednisone 20 mg, 2 tabs po daily for 1 week, then 1 tab po daily for 1 week #21, 0 ref

## 2013-09-18 NOTE — Telephone Encounter (Signed)
Rx called to pharmacy as instructed.. Patient's wife Vernona RiegerLaura notified by telephone.

## 2013-09-18 NOTE — Telephone Encounter (Signed)
PCP out of office, routed to covering provider.

## 2013-09-18 NOTE — Telephone Encounter (Signed)
Patient Information:  Caller Name: LaURA  Phone: [phone]  Patient: Aaron Tyler, Aaron Tyler  Gender: Male  DOB: 02/05/1976  Age: 38 Years  PCP: Kerby NoraBedsole, Amy (Family Practice)  Office Follow Up:  Does the office need to follow up with this patient?: Yes  Instructions For The Office: Please call and advise   Symptoms  Reason For Call & Symptoms: Pts spouse is calling for a steroid. She was advised pt needs an appt for this but she refused " Im not going to pay $180.00 visit for this". [facility]. Pt mowed the lawn on Sat and has the poison ivy on his back Jonni Sanger/chest /arms are covered.  Reviewed Health History In EMR: Yes  Reviewed Medications In EMR: Yes  Reviewed Allergies In EMR: Yes  Reviewed Surgeries / Procedures: Yes  Date of Onset of Symptoms: 09/15/2013  Treatments Tried: Alcohol; Calamine lotion and Benadryl cream  Treatments Tried Worked: No  Guideline(s) Used:  Poison Ivy - [facility] or [facility]  Disposition Per Guideline:   See Today in Office  Reason For Disposition Reached:   Severe itching interferes with normal activities (e.g., work or school) or prevents sleep  Advice Given:  N/A  Patient Refused Recommendation:  Patient Requests Prescription  Pts spouse is requesting a script for [facility]one be [facility] [place]

## 2014-01-22 ENCOUNTER — Other Ambulatory Visit: Payer: Self-pay | Admitting: Family Medicine

## 2014-05-07 ENCOUNTER — Encounter: Payer: Self-pay | Admitting: Family Medicine

## 2014-05-07 ENCOUNTER — Ambulatory Visit (INDEPENDENT_AMBULATORY_CARE_PROVIDER_SITE_OTHER): Payer: 59 | Admitting: Family Medicine

## 2014-05-07 VITALS — BP 140/100 | HR 85 | Temp 97.8°F | Ht 75.0 in | Wt 258.2 lb

## 2014-05-07 DIAGNOSIS — J4521 Mild intermittent asthma with (acute) exacerbation: Secondary | ICD-10-CM

## 2014-05-07 DIAGNOSIS — J189 Pneumonia, unspecified organism: Secondary | ICD-10-CM

## 2014-05-07 MED ORDER — AZITHROMYCIN 250 MG PO TABS: ORAL_TABLET | ORAL | Status: DC

## 2014-05-07 NOTE — Progress Notes (Signed)
Pre visit review using our clinic review tool, if applicable. No additional management support is needed unless otherwise documented below in the visit note. 
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
## 2014-08-04 ENCOUNTER — Telehealth: Payer: Self-pay | Admitting: Family Medicine

## 2014-08-05 NOTE — Telephone Encounter (Signed)
Left message asking pt to call office  °

## 2014-08-05 NOTE — Telephone Encounter (Signed)
Pt left v/m requesting cb at [phone].

## 2014-08-05 NOTE — Telephone Encounter (Signed)
Please call and schedule CPE with Dr. Ermalene Searing.  Needs to schedule CPE prior to getting refills on his medication.  Once scheduled please route message back to me so I can refill his medication to get him to his appointment.

## 2014-08-05 NOTE — Telephone Encounter (Signed)
Last office visit 05/07/2014 with Dr. Patsy Lager for PNA.  Last CPE 04/24/2012.  Ok to refill?

## 2014-08-05 NOTE — Telephone Encounter (Signed)
Needs apt for CPX. Refill until then.

## 2014-08-06 MED ORDER — FLUTICASONE-SALMETEROL 100-50 MCG/DOSE IN AEPB: INHALATION_SPRAY | RESPIRATORY_TRACT | Status: DC

## 2014-08-06 NOTE — Telephone Encounter (Signed)
Pt left v/m; pt scheduled appt 08/15/14 at 8:15 AM with Dr Ermalene Searing; pt going out of town and request Advair sent to pharmacy today; pt request cb.

## 2014-08-06 NOTE — Addendum Note (Signed)
Addended by: Damita Lack on: 08/06/2014 09:44 AM   Modules accepted: Orders

## 2014-08-06 NOTE — Telephone Encounter (Signed)
appointment 6/24

## 2014-08-06 NOTE — Telephone Encounter (Signed)
Aaron Tyler notified refill on Advair has been sent to his pharmacy.

## 2014-08-15 ENCOUNTER — Other Ambulatory Visit: Payer: Self-pay | Admitting: Family Medicine

## 2014-08-15 ENCOUNTER — Ambulatory Visit (INDEPENDENT_AMBULATORY_CARE_PROVIDER_SITE_OTHER): Payer: 59 | Admitting: Family Medicine

## 2014-08-15 ENCOUNTER — Encounter: Payer: Self-pay | Admitting: Family Medicine

## 2014-08-15 VITALS — BP 158/102 | HR 87 | Temp 97.8°F | Wt 253.5 lb

## 2014-08-15 DIAGNOSIS — I10 Essential (primary) hypertension: Secondary | ICD-10-CM | POA: Diagnosis not present

## 2014-08-15 DIAGNOSIS — J4540 Moderate persistent asthma, uncomplicated: Secondary | ICD-10-CM | POA: Diagnosis not present

## 2014-08-15 DIAGNOSIS — M25511 Pain in right shoulder: Secondary | ICD-10-CM

## 2014-08-15 DIAGNOSIS — J4521 Mild intermittent asthma with (acute) exacerbation: Secondary | ICD-10-CM

## 2014-08-15 LAB — COMPREHENSIVE METABOLIC PANEL
ALBUMIN: 4.6 g/dL (ref 3.5–5.2)
ALT: 49 U/L (ref 0–53)
AST: 25 U/L (ref 0–37)
Alkaline Phosphatase: 35 U/L — ABNORMAL LOW (ref 39–117)
BILIRUBIN TOTAL: 1.2 mg/dL (ref 0.2–1.2)
BUN: 15 mg/dL (ref 6–23)
CALCIUM: 9.9 mg/dL (ref 8.4–10.5)
CO2: 31 mEq/L (ref 19–32)
CREATININE: 1.15 mg/dL (ref 0.40–1.50)
Chloride: 103 mEq/L (ref 96–112)
GFR: 75.72 mL/min (ref >60.00)
GLUCOSE: 92 mg/dL (ref 70–99)
Potassium: 4.7 mEq/L (ref 3.5–5.1)
Sodium: 139 mEq/L (ref 135–145)
TOTAL PROTEIN: 7.6 g/dL (ref 6.0–8.3)

## 2014-08-15 LAB — CBC WITH DIFFERENTIAL/PLATELET
BASOS ABS: 0 10*3/uL (ref 0.0–0.1)
Basophils Relative: 0.3 % (ref 0.0–3.0)
EOS ABS: 0.3 10*3/uL (ref 0.0–0.7)
EOS PCT: 5.7 % — AB (ref 0.0–5.0)
HCT: 45.5 % (ref 39.0–52.0)
Hemoglobin: 15.6 g/dL (ref 13.0–17.0)
LYMPHS PCT: 36 % (ref 12.0–46.0)
Lymphs Abs: 2.2 10*3/uL (ref 0.7–4.0)
MCHC: 34.3 g/dL (ref 30.0–36.0)
MCV: 89.3 fl (ref 78.0–100.0)
MONO ABS: 0.5 10*3/uL (ref 0.1–1.0)
MONOS PCT: 8.5 % (ref 3.0–12.0)
NEUTROS PCT: 49.5 % (ref 43.0–77.0)
Neutro Abs: 3 10*3/uL (ref 1.4–7.7)
Platelets: 255 10*3/uL (ref 150.0–400.0)
RBC: 5.1 Mil/uL (ref 4.22–5.81)
RDW: 13.4 % (ref 11.5–15.5)
WBC: 6 10*3/uL (ref 4.0–10.5)

## 2014-08-15 LAB — LIPID PANEL
CHOLESTEROL: 284 mg/dL — AB (ref 0–200)
HDL: 43.4 mg/dL (ref >39.00)
LDL CALC: 207 mg/dL — AB (ref 0–99)
NONHDL: 240.6
Total CHOL/HDL Ratio: 7
Triglycerides: 169 mg/dL — ABNORMAL HIGH (ref 0.0–149.0)
VLDL: 33.8 mg/dL (ref 0.0–40.0)

## 2014-08-15 LAB — TSH: TSH: 2.1 u[IU]/mL (ref 0.35–4.50)

## 2014-08-15 MED ORDER — LOSARTAN POTASSIUM-HCTZ 50-12.5 MG PO TABS: 1.0000 | ORAL_TABLET | Freq: Every day | ORAL | Status: DC

## 2014-08-15 MED ORDER — HYDROCODONE-ACETAMINOPHEN 7.5-325 MG PO TABS: 1.0000 | ORAL_TABLET | Freq: Four times a day (QID) | ORAL | Status: DC | PRN

## 2014-08-15 NOTE — Assessment & Plan Note (Signed)
Stable control on advair.

## 2014-08-15 NOTE — Assessment & Plan Note (Signed)
New diagnosis. Eval for end organ damage, secondary causes and risk factors.

## 2014-08-15 NOTE — Patient Instructions (Addendum)
Stop at front desk on way out to set up MRI.  Stop at lab on way out.  Start losartan HCTZ. Follow BP at home, record and bring to next appt.  Work on [employer], restart exercise, weight loss.   Follow up in 2-4 weeks for CPX no labs prior. Stop tea and juice.

## 2014-08-15 NOTE — Progress Notes (Signed)
   Subjective:    Patient ID: Aaron Tyler, male    DOB: Nov 26, 1975, 39 y.o.   MRN: [id number]  HPI  39 year old male presents for follow up.   Asthma, moderate persistant:  Well controlled on advair 100/50 ,cg daily.  Sick visit in 04/2014 dx with PNA.  Using albuterol prn, rarely needing except with   Right shoulder pain x 2 years, worse in the last few months : Saw Dr. Patsy Lager in past in 01/2013 for same pain. Felt due to rotator cuff tendonitis and subacromial   Poor control , keeping him up at night.  Pain in right upper shoulder, radiates to neck, anterior shoulder pain,  Weakness in right arm, no numbness, no radiation to hand.  Using hydrocodone 1/2 nightly or every other night. Helps some.  Using ibuprofen  800 mg for inflammation. No help   Home exercises are worsening apin.   Also seeing Dr Cheral Almas ( his other PCP)  in [place] for same issue.  Xray showed:  Bone spur)  Had steroid injection 06/30/2104.[name] Helped for a few days, but not much.  Has pending MRI.[name] Has not done yet.   Blood pressure is elevated today. At home BP running 130-145/95-104 in last several months.  Has been traveling, more sendentary, moderate diet, eating fast food more often.  Wt Readings from Last 3 Encounters:  08/15/14 253 lb 8 oz (114.987 kg)  05/07/14 258 lb 4 oz (117.141 kg)  07/26/13 255 lb 8 oz (115.894 kg)   BP Readings from Last 3 Encounters:  08/15/14 158/102  05/07/14 140/100  07/26/13 130/100       Review of Systems  Constitutional: Negative for fever and fatigue.  HENT: Negative for ear pain.   Eyes: Negative for pain.  Respiratory: Negative for cough and shortness of breath.   Cardiovascular: Negative for chest pain, palpitations and leg swelling.  Gastrointestinal: Negative for abdominal pain.  Genitourinary: Negative for dysuria.  Musculoskeletal: Negative for arthralgias.  Neurological: Negative for syncope, light-headedness and headaches.  Psychiatric/Behavioral:  Negative for dysphoric mood.       Objective:   Physical Exam  Constitutional: Vital signs are normal. He appears well-developed and well-nourished.  HENT:  Head: Normocephalic.  Right Ear: Hearing normal.  Left Ear: Hearing normal.  Nose: Nose normal.  Mouth/Throat: Oropharynx is clear and moist and mucous membranes are normal.  Neck: Trachea normal. Carotid bruit is not present. No thyroid mass and no thyromegaly present.  Cardiovascular: Normal rate, regular rhythm and normal pulses.  Exam reveals no gallop, no distant heart sounds and no friction rub.   No murmur heard. No peripheral edema  Pulmonary/Chest: Effort normal and breath sounds normal. No respiratory distress.  Musculoskeletal:       Right shoulder: He exhibits decreased range of motion, tenderness and bony tenderness.       Cervical back: He exhibits tenderness. He exhibits normal range of motion and no bony tenderness.  Neg spurling's  psiitive neer's  neg drop arm  Skin: Skin is warm, dry and intact. No rash noted.  Psychiatric: He has a normal mood and affect. His speech is normal and behavior is normal. Thought content normal.          Assessment & Plan:

## 2014-08-15 NOTE — Progress Notes (Signed)
Pre visit review using our clinic review tool, if applicable. No additional management support is needed unless otherwise documented below in the visit note. 

## 2014-08-15 NOTE — Assessment & Plan Note (Signed)
Poor control of pain. Ongoing 2 years, worse in last few months. No responsive to NSAIDs and cortisone injection.  Refer for MRI. Refill hydrocodone.

## 2014-08-18 ENCOUNTER — Encounter: Payer: Self-pay | Admitting: *Deleted

## 2014-08-31 ENCOUNTER — Other Ambulatory Visit: Payer: Self-pay | Admitting: Family Medicine

## 2014-09-05 ENCOUNTER — Ambulatory Visit (INDEPENDENT_AMBULATORY_CARE_PROVIDER_SITE_OTHER): Payer: 59 | Admitting: Family Medicine

## 2014-09-05 ENCOUNTER — Encounter: Payer: Self-pay | Admitting: Family Medicine

## 2014-09-05 ENCOUNTER — Telehealth: Payer: Self-pay | Admitting: Family Medicine

## 2014-09-05 VITALS — BP 120/80 | HR 108 | Temp 97.8°F | Ht 74.0 in | Wt 252.0 lb

## 2014-09-05 DIAGNOSIS — E7800 Pure hypercholesterolemia, unspecified: Secondary | ICD-10-CM

## 2014-09-05 DIAGNOSIS — Z0000 Encounter for general adult medical examination without abnormal findings: Secondary | ICD-10-CM | POA: Diagnosis not present

## 2014-09-05 DIAGNOSIS — I10 Essential (primary) hypertension: Secondary | ICD-10-CM

## 2014-09-05 MED ORDER — HYDROCODONE-ACETAMINOPHEN 7.5-325 MG PO TABS: 1.0000 | ORAL_TABLET | Freq: Four times a day (QID) | ORAL | Status: DC | PRN

## 2014-09-05 NOTE — Assessment & Plan Note (Signed)
Improved control on losartan HCTZ.  Encouraged exercise, weight loss, healthy eating habits.

## 2014-09-05 NOTE — Progress Notes (Signed)
The patient is here for annual wellness exam and preventative care.    Hypertension:   Much improved on losartan HCTZ. BP Readings from Last 3 Encounters:  09/05/14 120/80  08/15/14 158/102  05/07/14 140/100  Using medication without problems or lightheadedness:  None Chest pain with exertion: None Edema: None Short of breath: None Average home BPs: 118/85 Other issues: Improved energy.  He has been decreasing sugar in diet. Working on lower cholesterol in diet.   Wt Readings from Last 3 Encounters:  09/05/14 252 lb (114.306 kg)  08/15/14 253 lb 8 oz (114.987 kg)  05/07/14 258 lb 4 oz (117.141 kg)   Asthma, moderate persistant: On advair every other day. Has not required albuterol in last 6 -12 months.   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   High triglycerides and LDL above goal 160. Plan recheck in 3 months. Lab Results  Component Value Date   CHOL 284* 08/15/2014   HDL 43.40 08/15/2014   LDLCALC 207* 08/15/2014   LDLDIRECT 167.2 04/17/2012   TRIG 169.0* 08/15/2014   CHOLHDL 7 08/15/2014     Review of Systems  Constitutional: Negative for fever, fatigue and unexpected weight change.  HENT: Negative for ear pain, congestion, sore throat, rhinorrhea, trouble swallowing and postnasal drip.  Eyes: Negative for pain.  Respiratory: Negative for cough, shortness of breath and wheezing.  Cardiovascular: Negative for chest pain, palpitations and leg swelling.  Gastrointestinal: Negative for nausea, abdominal pain, diarrhea, constipation and blood in stool.  Genitourinary: Negative for dysuria, urgency, hematuria, discharge, penile swelling, scrotal swelling, difficulty urinating, penile pain and testicular pain.  No concern for STD testing.  Skin: Negative for rash.  Neurological: Negative for syncope, weakness, light-headedness, numbness and headaches.  Psychiatric/Behavioral: Negative for behavioral problems and dysphoric mood. The patient is not  nervous/anxious.  Objective:   Physical Exam  Constitutional: He appears well-developed and well-nourished. Non-toxic appearance. He does not appear ill. No distress.  HENT:  Head: Normocephalic and atraumatic.  Right Ear: Hearing, tympanic membrane, external ear and ear canal normal.  Left Ear: Hearing, tympanic membrane, external ear and ear canal normal.  Nose: Nose normal.  Mouth/Throat: Uvula is midline, oropharynx is clear and moist and mucous membranes are normal.  Eyes: Conjunctivae, EOM and lids are normal. Pupils are equal, round, and reactive to light. No foreign bodies found.  Neck: Trachea normal, normal range of motion and phonation normal. Neck supple. Carotid bruit is not present. No mass and no thyromegaly present.  Cardiovascular: Normal rate, regular rhythm, S1 normal, S2 normal, intact distal pulses and normal pulses. Exam reveals no gallop.  No murmur heard.  Pulmonary/Chest: Breath sounds normal. He has no wheezes. He has no rhonchi. He has no rales.  Abdominal: Soft. Normal appearance and bowel sounds are normal. There is no hepatosplenomegaly. There is no tenderness. There is no rebound, no guarding and no CVA tenderness. No hernia. Hernia confirmed negative in the right inguinal area and confirmed negative in the left inguinal area.  Genitourinary: Testes normal and penis normal. Right testis shows no mass and no tenderness. Left testis shows no mass and no tenderness. No paraphimosis or penile tenderness.  Lymphadenopathy:  He has no cervical adenopathy.  Right: No inguinal adenopathy present.  Left: No inguinal adenopathy present.  MSK: right shoulder ttp over acromioclavicular joint and subacromial joint, mildly positive cross over, pain with internal roation and mildly positive impingement sign, negative drop arm test. Neurological: He is alert. He has normal strength and normal  reflexes. No cranial nerve deficit or sensory deficit. Gait normal.   Skin: Skin is warm, dry and intact. No rash noted.  Psychiatric: He has a slightly anxious mood,nml affect. His speech is mildly pressured and behavior is normal. Judgment normal.  Assessment & Plan:   CPX: The patient's preventative maintenance and recommended screening tests for an annual wellness exam were reviewed in full today.  Brought up to date unless services declined.  Counselled on the importance of diet, exercise, and its role in overall health and mortality.  The patient's FH and SH was reviewed, including their home life, tobacco status, and drug and alcohol status.   Vaccines: Td, PNA uptodate. No STD testing.

## 2014-09-05 NOTE — Progress Notes (Signed)
Pre visit review using our clinic review tool, if applicable. No additional management support is needed unless otherwise documented below in the visit note. 

## 2014-09-05 NOTE — Telephone Encounter (Signed)
Left message for Aaron Tyler that his prescription is ready to be picked up at the front desk. 

## 2014-09-05 NOTE — Telephone Encounter (Signed)
Pt needs refill on hydrocodone He will be out on the 26th He has an appointment that day

## 2014-09-05 NOTE — Patient Instructions (Addendum)
Make appt for 30 min lesion removal for right left leg lesion if interested.  Keep up great work on decreasing cholesterol in diet, increase exercise when able.  Schedule lab  Only appt for cholesterol check in 3 months.

## 2014-09-05 NOTE — Assessment & Plan Note (Signed)
Reviewed info on diet and exercise. Refer to nutritionist.

## 2014-09-08 ENCOUNTER — Ambulatory Visit
Admission: RE | Admit: 2014-09-08 | Discharge: 2014-09-08 | Disposition: A | Discharge status: Home / Self Care | Payer: 59 | Source: Ambulatory Visit | Attending: Family Medicine | Admitting: Family Medicine

## 2014-09-08 DIAGNOSIS — M25511 Pain in right shoulder: Secondary | ICD-10-CM

## 2014-09-08 MED ORDER — IOHEXOL 180 MG/ML  SOLN
15.0000 mL | Freq: Once | INTRAMUSCULAR | Status: AC | PRN
  Administered 2014-09-08: 15 mL via INTRA_ARTICULAR

## 2014-09-16 ENCOUNTER — Ambulatory Visit (INDEPENDENT_AMBULATORY_CARE_PROVIDER_SITE_OTHER): Payer: 59 | Admitting: Family Medicine

## 2014-09-16 ENCOUNTER — Telehealth: Payer: Self-pay | Admitting: Family Medicine

## 2014-09-16 ENCOUNTER — Encounter: Payer: Self-pay | Admitting: Family Medicine

## 2014-09-16 VITALS — BP 120/98 | HR 90 | Temp 97.9°F | Ht 74.0 in | Wt 258.8 lb

## 2014-09-16 DIAGNOSIS — M25511 Pain in right shoulder: Secondary | ICD-10-CM

## 2014-09-16 DIAGNOSIS — L989 Disorder of the skin and subcutaneous tissue, unspecified: Secondary | ICD-10-CM | POA: Diagnosis not present

## 2014-09-16 NOTE — Progress Notes (Signed)
Shave biopsy  Meds, vitals, and allergies reviewed.   Indication: bothersome  Lesion, darkening  Location: right lateral leg  Size:1.25 cm  Verbal informed consent obtained.  Pt aware of risks not limited to but including infection,  bleeding, damage to near by organs.  Prep: etoh/betadine  Anesthesia: 2%lidocaine with epi, good effect  Shave made with dermablade  Minimal oozing, controlled with silver nitrate  Tolerated well  Routine postprocedure instructions d/w pt- keep area clean and bandaged, follow up if concerns/spreading erythema/pain.

## 2014-09-16 NOTE — Addendum Note (Signed)
Addended by: Damita Lack on: 09/16/2014 12:11 PM   Modules accepted: Orders

## 2014-09-16 NOTE — Telephone Encounter (Signed)
Mr. Waddington notified as instructed by telephone.  He would like you to call him.  I was not able to answer his questions he had about the MRI.  He said he would be available all week at [phone].

## 2014-09-16 NOTE — Telephone Encounter (Signed)
Notify pt we did receive MRI results. I mistakenly though ORTHO received a report as well. Let him known that there was evidence of supraspinatus and infraspinatus tendinitis without a tear.  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 (A Bennett lesion is mineralization that develops at the posteroinferior glenoid in throwing athletes. The underlying pathogenesis is thought to involve repetitive capsular traction at the attachment of the posteroinferior glenohumeral ligament . Bennett lesions are usually asymptomatic. However, they may cause posterior shoulder pain in the throwing athlete.)  He needs to return to [facility] or see sports med to treat these issues.  If he would like a call to review the findings or if you feel uncomfortable giving him the results, let me know.

## 2014-09-16 NOTE — Progress Notes (Signed)
Pre visit review using our clinic review tool, if applicable. No additional management support is needed unless otherwise documented below in the visit note. 

## 2014-09-18 ENCOUNTER — Telehealth: Payer: Self-pay | Admitting: Family Medicine

## 2014-09-18 NOTE — Telephone Encounter (Signed)
Patient returned Dr.Bedsole's call.

## 2014-09-18 NOTE — Addendum Note (Signed)
Addended by: Kerby Nora E on: 09/18/2014 02:14 PM   Modules accepted: Orders

## 2014-09-29 ENCOUNTER — Ambulatory Visit: Payer: 59 | Admitting: Dietician

## 2014-10-10 ENCOUNTER — Other Ambulatory Visit: Payer: Self-pay

## 2014-10-10 MED ORDER — HYDROCODONE-ACETAMINOPHEN 7.5-325 MG PO TABS: 1.0000 | ORAL_TABLET | Freq: Four times a day (QID) | ORAL | Status: DC | PRN

## 2014-10-10 NOTE — Telephone Encounter (Signed)
Pt left v/m requesting rx hydrocodone apap. Call when ready for pick up. pts last annual exam and rx last printed # 30 on 09/05/14.

## 2014-10-13 NOTE — Telephone Encounter (Signed)
Aaron Tyler notified by telephone that his prescription is ready to be picked up at the front desk.

## 2014-10-20 ENCOUNTER — Other Ambulatory Visit: Payer: Self-pay | Admitting: *Deleted

## 2014-10-20 MED ORDER — LOSARTAN POTASSIUM-HCTZ 50-12.5 MG PO TABS: 1.0000 | ORAL_TABLET | Freq: Every day | ORAL | Status: DC

## 2014-10-20 NOTE — Telephone Encounter (Signed)
Patient called stating that he has to switch his medications from [facility] to [facility] because of insurance. Patient stated that he was told by the pharmacy that he had to call the office and request this. Script for Losartan/HCTZ sent to the pharmacy per patient's request. Patient notified by telephone that his request has been taken care of.

## 2014-11-07 ENCOUNTER — Other Ambulatory Visit: Payer: Self-pay

## 2014-11-07 MED ORDER — HYDROCODONE-ACETAMINOPHEN 7.5-325 MG PO TABS: 1.0000 | ORAL_TABLET | Freq: Four times a day (QID) | ORAL | Status: DC | PRN

## 2014-11-07 NOTE — Telephone Encounter (Signed)
Pt left v/m requesting rx hydrocodone apap. Call when ready for pick up. Last rx printed # 30 on 10/10/14 and pt last annual exam on 09/05/14.

## 2014-11-10 NOTE — Telephone Encounter (Signed)
[name] notified prescription is ready to be picked up at the front desk.

## 2014-11-11 ENCOUNTER — Other Ambulatory Visit: Payer: Self-pay | Admitting: Family Medicine

## 2014-12-15 ENCOUNTER — Other Ambulatory Visit: Payer: 59

## 2014-12-16 ENCOUNTER — Other Ambulatory Visit: Payer: Self-pay | Admitting: *Deleted

## 2014-12-16 ENCOUNTER — Encounter: Payer: Self-pay | Admitting: Family Medicine

## 2014-12-16 MED ORDER — HYDROCODONE-ACETAMINOPHEN 7.5-325 MG PO TABS: 1.0000 | ORAL_TABLET | Freq: Four times a day (QID) | ORAL | Status: DC | PRN

## 2014-12-16 NOTE — Telephone Encounter (Signed)
What is the status of ortho recommendations. Call pt or get last OV note to find out. THis is not intended to be a long term med.

## 2014-12-16 NOTE — Telephone Encounter (Signed)
Office note requested from Dr. Luiz BlareGraves at [facility].

## 2014-12-16 NOTE — Telephone Encounter (Signed)
CPE 08/2014

## 2014-12-16 NOTE — Telephone Encounter (Signed)
Left message for Aaron Tyler that his prescription is ready to be picked up at the front desk. 

## 2015-01-09 ENCOUNTER — Ambulatory Visit: Payer: 59 | Admitting: Family Medicine

## 2015-01-09 DIAGNOSIS — Z0289 Encounter for other administrative examinations: Secondary | ICD-10-CM

## 2015-01-23 ENCOUNTER — Other Ambulatory Visit: Payer: Self-pay

## 2015-01-23 MED ORDER — HYDROCODONE-ACETAMINOPHEN 7.5-325 MG PO TABS: 1.0000 | ORAL_TABLET | Freq: Four times a day (QID) | ORAL | Status: DC | PRN

## 2015-01-23 NOTE — Telephone Encounter (Signed)
[name] notified prescription is ready to be picked up the front desk.

## 2015-01-23 NOTE — Telephone Encounter (Signed)
Pt request rx hydrocodone apap. Call when ready for pick up. Pt thought he requested on 01/20/15 but did not get message. Pt is out of med and request to pick up rx 01/23/15. Last printed # 30 on 12/16/14 and last annual exam on 09/05/14.

## 2015-02-17 ENCOUNTER — Other Ambulatory Visit: Payer: Self-pay

## 2015-02-17 MED ORDER — HYDROCODONE-ACETAMINOPHEN 7.5-325 MG PO TABS: 1.0000 | ORAL_TABLET | Freq: Four times a day (QID) | ORAL | Status: DC | PRN

## 2015-02-17 NOTE — Telephone Encounter (Signed)
Pt left v/m requesting rx hydrocodone apap. Call when ready for pick up. rx last printed # 30 on 01/23/15. Last annual 09/05/2014.

## 2015-02-18 NOTE — Telephone Encounter (Signed)
Left message for Aaron Tyler that his prescription is ready to be picked up at the front desk. 

## 2015-03-19 ENCOUNTER — Other Ambulatory Visit: Payer: Self-pay

## 2015-03-19 MED ORDER — HYDROCODONE-ACETAMINOPHEN 7.5-325 MG PO TABS: 1.0000 | ORAL_TABLET | Freq: Four times a day (QID) | ORAL | Status: DC | PRN

## 2015-03-19 NOTE — Telephone Encounter (Signed)
Pt left v/m requesting rx hydrocodone apap. Call when ready for pick up. Last printed # 30 on 02/17/15. Last annual exam on 09/05/14.

## 2015-03-19 NOTE — Telephone Encounter (Signed)
Left message for Aaron Tyler that his prescription is ready to be picked up at the front desk.

## 2015-04-15 ENCOUNTER — Other Ambulatory Visit: Payer: Self-pay | Admitting: Family Medicine

## 2015-04-15 NOTE — Telephone Encounter (Signed)
Pt left v/m requesting rx hydrocodone apap. Call when ready for pick up. Last printed # 30 on 03/19/15. Last annual exam on 09/05/14.

## 2015-04-16 MED ORDER — HYDROCODONE-ACETAMINOPHEN 7.5-325 MG PO TABS: 1.0000 | ORAL_TABLET | Freq: Four times a day (QID) | ORAL | Status: DC | PRN

## 2015-04-16 NOTE — Telephone Encounter (Signed)
Refill as requested 

## 2015-04-16 NOTE — Telephone Encounter (Addendum)
Left message for Kumar that his prescription is ready to be picked up at the front desk. 

## 2015-05-08 ENCOUNTER — Other Ambulatory Visit: Payer: Self-pay

## 2015-05-08 MED ORDER — HYDROCODONE-ACETAMINOPHEN 7.5-325 MG PO TABS: 1.0000 | ORAL_TABLET | Freq: Four times a day (QID) | ORAL | Status: DC | PRN

## 2015-05-08 NOTE — Telephone Encounter (Signed)
Pt left v/m requesting rx hydrocodone apap. Call when ready for pick up. Last printed # 30 on 04/16/15; last annual 09/05/14.

## 2015-05-08 NOTE — Telephone Encounter (Signed)
Rx signed and up front for pickup

## 2015-05-08 NOTE — Telephone Encounter (Signed)
Left message on vm that his request was ready.

## 2015-06-09 ENCOUNTER — Other Ambulatory Visit: Payer: Self-pay

## 2015-06-09 MED ORDER — HYDROCODONE-ACETAMINOPHEN 7.5-325 MG PO TABS: 1.0000 | ORAL_TABLET | Freq: Four times a day (QID) | ORAL | Status: DC | PRN

## 2015-06-09 NOTE — Telephone Encounter (Signed)
Pt left v/m requesting rx hydrocodone apap. Call when ready for pick up. rx last printed # 30 on 05/08/15. Last annual exam on 09/05/14.

## 2015-06-09 NOTE — Telephone Encounter (Signed)
Left message for Klye that his prescription is ready to be picked up at the front desk. 

## 2015-07-06 ENCOUNTER — Other Ambulatory Visit: Payer: Self-pay

## 2015-07-06 NOTE — Telephone Encounter (Signed)
Pt left v/m requesting rx hydrocodone apap. Call when ready for pick up. Last printed # 30 on 06/09/15; last seen for annual exam on 09/05/14.

## 2015-07-07 MED ORDER — HYDROCODONE-ACETAMINOPHEN 7.5-325 MG PO TABS: 1.0000 | ORAL_TABLET | Freq: Four times a day (QID) | ORAL | Status: DC | PRN

## 2015-07-07 NOTE — Telephone Encounter (Signed)
Left message for Aaron Tyler that his prescription is ready to be picked up at the front desk. 

## 2015-07-16 ENCOUNTER — Encounter: Payer: Self-pay | Admitting: Family Medicine

## 2015-07-16 ENCOUNTER — Ambulatory Visit (INDEPENDENT_AMBULATORY_CARE_PROVIDER_SITE_OTHER): Payer: BLUE CROSS/BLUE SHIELD | Admitting: Family Medicine

## 2015-07-16 VITALS — BP 120/90 | HR 79 | Temp 97.7°F | Ht 74.0 in | Wt 251.5 lb

## 2015-07-16 DIAGNOSIS — M79671 Pain in right foot: Secondary | ICD-10-CM | POA: Diagnosis not present

## 2015-07-16 DIAGNOSIS — B356 Tinea cruris: Secondary | ICD-10-CM

## 2015-07-16 MED ORDER — FLUCONAZOLE 150 MG PO TABS
300.0000 mg | ORAL_TABLET | ORAL | Status: DC
Start: 2015-07-16 — End: 2016-01-22

## 2015-07-16 NOTE — Progress Notes (Signed)
Dr. Karleen Hampshire T. Makinzee Durley, MD, CAQ Sports [facility] [address] [place], [address] Phone: [phone] Fax: [phone]  07/16/2015  Patient: Aaron Tyler, MRN: [id number], DOB: Jun 22, 1975, 40 y.o.  Primary Physician:  Kerby Nora, MD   Chief Complaint  Patient presents with  . Skin Redness on Right Foot   Subjective:   Aaron Tyler is a 40 y.o. very pleasant male patient who presents with the following:  R 1-2 toe redness / purplish color. This is been progressing over the last 10 days. He has used some alcohol, and additionally he's used some Lotrimin for a couple of days. This seems to have progressed for about the last few days, and he has not had any sort of bite or injury that he can recall. It is not red or warm.  Past Medical History, Surgical History, Social History, Family History, Problem List, Medications, and Allergies have been reviewed and updated if relevant.  Patient Active Problem List   Diagnosis Date Noted  . Essential hypertension, benign 08/15/2014  . Right shoulder pain 04/27/2012  . Generalized anxiety disorder 04/24/2012  . Hypercholesteremia 03/03/2011  . ADD 02/26/2010  . PULMONARY NODULE, SOLITARY 12/04/2009  . VARICOSE VEINS, LOWER EXTREMITIES 07/29/2009  . ALLERGIC RHINITIS 05/15/2007  . Moderate persistent asthma 05/15/2007    Past Medical History  Diagnosis Date  . Unspecified asthma(493.90)   . Pollen allergies   . 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 ADD (attention deficit disorder) without hyperactivity   . History of tobacco abuse quit 2006  . Shingles     Past Surgical History  Procedure Laterality Date  . Varicose vein surgery      sclerotherapy  . Esophagogastroduodenoscopy      ?achalasia  . Endovenous ablation saphenous vein w/ laser  06-16-2011    right greater saphenous vein and stab phlebectomy >20 right leg  . Surgical removal of splinter  (foreign body) right foot  06-2011    right foot  . Endovenous ablation saphenous vein w/ laser  09-15-2011    left greater saphenous vein and stab phlebectomies>20 Incisions left leg    Social History   Social History  . Marital Status: Married    Spouse Name: N/A  . Number of Children: N/A  . Years of Education: N/A   Occupational History  . securities and investments    Social History Main Topics  . Smoking status: Former Smoker -- 0.25 packs/day    Types: Cigarettes    Quit date: 02/21/2001  . Smokeless tobacco: Never Used     Comment: pt. smoked in 2003 for about 3 months on a social basis  . Alcohol Use: No  . Drug Use: No  . Sexual Activity: Not on file   Other Topics Concern  . Not on file   Social History Narrative   Regular exercise---yes, not regularly but sometimes 1-2 times per week      Diet--- chick salad, organics, + h2o, some fast food, occasionally skips breakfast          Family History  Problem Relation Age of Onset  . Heart disease Mother   . Heart attack Father 39  . Hypertension Father   . Hyperlipidemia Brother   . Cancer Brother     throat  . Cancer Maternal Grandfather     lung  . Hyperlipidemia Brother   . Hyperlipidemia Brother  No Known Allergies  Medication list reviewed and updated in full in [facility].  ROS: GEN: Acute illness details above GI: Tolerating PO intake GU: maintaining adequate hydration and urination Pulm: No SOB Interactive and getting along well at home.  Otherwise, ROS is as per the HPI.  Objective:   BP 120/90 mmHg  Pulse 79  Temp(Src) 97.7 F (36.5 C) (Oral)  Ht 6\' 2"  (1.88 m)  Wt 251 lb 8 oz (114.08 kg)  BMI 32.28 kg/m2   GEN: WDWN, NAD, Non-toxic, Alert & Oriented x 3 HEENT: Atraumatic, Normocephalic.  Ears and Nose: No external deformity. EXTR: No clubbing/cyanosis/edema NEURO: Normal gait.  PSYCH: Normally interactive. Conversant. Not depressed or anxious appearing.  Calm  demeanor.    Right foot with some mild redness and purplish coloration approximately 1 inch from the webspace between one and 2. This is in a radius around that area. It is not warm. In and it is only minimally tender to touch.   Laboratory and Imaging Data:  Assessment and Plan:   Tinea cruris  Foot pain, right   Exact etiology isn't not entirely clear, but the highest on the differential would be a fungal infection. This does not appear to be classic cellulitis. Also had my partner look at this to concurred. I'll give the patient some Diflucan, and continue low treatment.  Edges demarcated. If he worsens over the weekend, recommended he seek further medical care.  Follow-up: No Follow-up on file.  New Prescriptions   FLUCONAZOLE (DIFLUCAN) 150 MG TABLET    Take 2 tablets (300 mg total) by mouth once a week.   Signed,  Elpidio GaleaSpencer T. Brenn Deziel, MD   Patient's Medications  New Prescriptions   FLUCONAZOLE (DIFLUCAN) 150 MG TABLET    Take 2 tablets (300 mg total) by mouth once a week.  Previous Medications   ADVAIR DISKUS 100-50 MCG/DOSE AEPB    INHALE 1 PUFF BY MOUTH EVERY DAY   ALBUTEROL (PROAIR HFA) 108 (90 BASE) MCG/ACT INHALER    Inhale 2 puffs into the lungs every 6 (six) hours as needed for wheezing.   HYDROCODONE-ACETAMINOPHEN (NORCO) 7.5-325 MG TABLET    Take 1 tablet by mouth every 6 (six) hours as needed for moderate pain.   LOSARTAN-HYDROCHLOROTHIAZIDE (HYZAAR) 50-12.5 MG PER TABLET    Take 1 tablet by mouth daily.  Modified Medications   No medications on file  Discontinued Medications   No medications on file

## 2015-08-05 ENCOUNTER — Other Ambulatory Visit: Payer: Self-pay

## 2015-08-05 NOTE — Telephone Encounter (Signed)
Pt left v/m requesting rx hydrocodone apap. Call when ready for pick up. rx last printed # 30 on 07/04/15; pt last annual exam on 09/05/14.no future appt scheduled.

## 2015-08-06 MED ORDER — HYDROCODONE-ACETAMINOPHEN 7.5-325 MG PO TABS: 1.0000 | ORAL_TABLET | Freq: Four times a day (QID) | ORAL | Status: DC | PRN

## 2015-08-06 NOTE — Telephone Encounter (Signed)
Left message for Aaron Tyler that his prescription is ready to be picked up at the front desk. 

## 2015-10-09 ENCOUNTER — Telehealth: Payer: Self-pay | Admitting: *Deleted

## 2015-10-09 DIAGNOSIS — E7800 Pure hypercholesterolemia, unspecified: Secondary | ICD-10-CM

## 2015-10-09 DIAGNOSIS — I10 Essential (primary) hypertension: Secondary | ICD-10-CM

## 2015-10-09 DIAGNOSIS — Z8249 Family history of ischemic heart disease and other diseases of the circulatory system: Secondary | ICD-10-CM

## 2015-10-09 MED ORDER — HYDROCODONE-ACETAMINOPHEN 7.5-325 MG PO TABS: 1.0000 | ORAL_TABLET | Freq: Four times a day (QID) | ORAL | 0 refills | Status: DC | PRN

## 2015-10-09 NOTE — Telephone Encounter (Signed)
PT would like to have an advanced lipid panel done when he comes in for labs due to reading some research on eating lots of greens and that being connected to non toxic metal in his blood and body. If you have any questions he said you can call him.

## 2015-10-09 NOTE — Telephone Encounter (Signed)
Left message for Aaron Tyler that his prescription is ready to be picked up at the front desk.

## 2015-10-09 NOTE — Telephone Encounter (Signed)
Call to find out what he is referring to.  ? "advance lipid panel" ? Terri do we have something like that?

## 2015-10-09 NOTE — Telephone Encounter (Signed)
Pt left v/m requesting rx hydrocodone apap. Call when ready for pick up. rx last printed # 30 on 08/06/15; pt last annual exam on 09/05/14.no future appt scheduled.

## 2015-10-09 NOTE — Addendum Note (Signed)
Addended by: Damita LackLORING, DONNA S on: 10/09/2015 01:06 PM   Modules accepted: Orders

## 2015-10-09 NOTE — Addendum Note (Signed)
Addended by: Damita LackLORING, Purnell Daigle S on: 10/09/2015 02:30 PM   Modules accepted: Orders

## 2015-10-09 NOTE — Telephone Encounter (Signed)
Needs a CPX for further refill. Refill once.

## 2015-10-12 NOTE — Telephone Encounter (Signed)
lmom for pt to return my call so I can clarify the lab tests he wants

## 2015-10-21 ENCOUNTER — Other Ambulatory Visit: Payer: Self-pay | Admitting: Family Medicine

## 2015-10-28 NOTE — Telephone Encounter (Signed)
Spoke to the patient, he just wants any lipid test that would [place] which lipid he had the most problem with. He said after working on weigh and exercise his results went up. He really wants to hold off taking statins until he has done any testing.

## 2015-10-29 NOTE — Telephone Encounter (Signed)
Let pt know I don't see how that would help.. We already know his bad cholesterol is very high.  The tests that look at separating out cholesterol  ( advance profiles) can help more if chol is normal to determine if we still need to treat when chol  Is low.  He cis lowan make an appt to discuss options other than statins or maybe better yet I can refer him to cardiology.

## 2015-10-29 NOTE — Telephone Encounter (Signed)
Left message for Carzell to return my call

## 2015-10-30 ENCOUNTER — Encounter: Payer: Self-pay | Admitting: *Deleted

## 2015-10-30 NOTE — Addendum Note (Signed)
Addended by: Kerby NoraBEDSOLE, AMY E on: 10/30/2015 05:14 PM   Modules accepted: Orders

## 2015-10-30 NOTE — Telephone Encounter (Signed)
See note

## 2015-10-30 NOTE — Telephone Encounter (Signed)
Dr. Graciela HusbandsKlein is an electrophysiologist.. Not a general cardiologist. He only treats rhythm issues.  I will refer [facility] to [facility], all the MDs there are great as well.

## 2015-10-30 NOTE — Telephone Encounter (Signed)
Ross notified as instructed by telephone.  He would like to be referred to Dr. Sherryl MangesSteven Klein, Cardiologist.

## 2015-11-02 NOTE — Telephone Encounter (Signed)
Left message for Aaron Tyler that Dr. Graciela HusbandsKlein is an electrophysiologist and only treats rhythm issues.  Dr. Ermalene SearingBedsole did put in a referral to [facility], which are all great as well.  Advised to call back if he has any questions.

## 2015-11-17 ENCOUNTER — Encounter: Payer: Self-pay | Admitting: Family Medicine

## 2015-12-22 ENCOUNTER — Other Ambulatory Visit: Payer: Self-pay | Admitting: Family Medicine

## 2015-12-28 ENCOUNTER — Encounter: Payer: Self-pay | Admitting: Cardiovascular Disease

## 2015-12-28 ENCOUNTER — Encounter (INDEPENDENT_AMBULATORY_CARE_PROVIDER_SITE_OTHER): Payer: Self-pay

## 2015-12-28 ENCOUNTER — Ambulatory Visit (INDEPENDENT_AMBULATORY_CARE_PROVIDER_SITE_OTHER): Payer: BLUE CROSS/BLUE SHIELD | Admitting: Cardiovascular Disease

## 2015-12-28 VITALS — BP 130/90 | HR 75 | Ht 72.0 in | Wt 253.5 lb

## 2015-12-28 DIAGNOSIS — I8390 Asymptomatic varicose veins of unspecified lower extremity: Secondary | ICD-10-CM | POA: Diagnosis not present

## 2015-12-28 DIAGNOSIS — E7800 Pure hypercholesterolemia, unspecified: Secondary | ICD-10-CM | POA: Diagnosis not present

## 2015-12-28 DIAGNOSIS — I10 Essential (primary) hypertension: Secondary | ICD-10-CM | POA: Diagnosis not present

## 2015-12-28 NOTE — Patient Instructions (Addendum)
Medication Instructions:   Options include: Low dose crestor 5 mg daily 40% drop Zetia one a day 10% drop Fenofibrate 7% drop Oatmeal.  Red Yeast Rice   If you continue to get dizzy with standing, Consider cutting the BP pill in 1/2   Goal blood pressure <140 on the top, <90 on the bottom   Labwork:  No new labs needed  Testing/Procedures:  No further testing at this time  Consider CT coronary calcium score every 5 to 10 years   Follow-Up: It was a pleasure seeing you in the office today. Please call us if you have new issues that need to be addressed before your next appt.  [phone]  Your physician wants you to follow-up in: as needed  If you need a refill on your cardiac medications before your next appointment, please call your pharmacy.

## 2015-12-28 NOTE — Progress Notes (Signed)
Cardiology Office Note  Date:  12/28/2015   ID:  Aaron NewcomerJohn R Tyler, DOB 03/09/1975, MRN [id number]  PCP:  Kerby NoraAmy Bedsole, MD   Chief Complaint  Patient presents with  . other    Ref by Dr. Ermalene SearingBedsole to discuss his cholesterol. Pt. c/o chest discomfort and anxiety.  Meds reviewed by the pt's med list.     HPI:  40 yo gentleman with history of hypertension, obesity, ADD, previous smoker, asthma , varicose veins who presents by referral from Dr. Ermalene Searingbedsole for consultation of his hyperlipidemia And cardiac risk factors, strong family history of coronary artery disease  He reports that he is active, healthy, 3 children ages 311, 355, 617 Feels he is overweight, has not been exercising or watching his diet as he could. Concerned about his high cholesterol given strong family history Lost 20 pounds recently, by watching his diet Not particularly eager to take a cholesterol medication  Feels he may be overmedicated in terms of his blood pressure Often has dizziness if he bends down and stands up too quickly Reported having this while working in the garden recently [employer] if he could decrease the dose of his blood pressure pill  CT scan 2012 abdomen reviewed with him in detail showing no coronary calcifications in the distal coronary arteries, no plaque noted in the descending aorta  EKG on today's visit shows normal sinus rhythm with rate 75 bpm, no significant ST or T-wave changes  Dad died at age 39 from MI, smoker, hyperlipidemia Mom with valve surgery   PMH:   has a past medical history of ADD (attention deficit disorder) without hyperactivity; History of tobacco abuse (quit 2006); Pollen allergies; Shingles; Unspecified asthma(493.90); and Varicose veins (LEG PAIN OVER CALF VARICOSITIES    HX OF SCLEROTHERAPY ).  PSH:    Past Surgical History:  Procedure Laterality Date  . ENDOVENOUS ABLATION SAPHENOUS VEIN W/ LASER  06-16-2011   right greater saphenous vein and stab phlebectomy >20 right leg  .  ENDOVENOUS ABLATION SAPHENOUS VEIN W/ LASER  09-15-2011   left greater saphenous vein and stab phlebectomies>20 Incisions left leg  . ESOPHAGOGASTRODUODENOSCOPY     ?achalasia  . surgical removal of splinter (foreign body) right foot  06-2011   right foot  . VARICOSE VEIN SURGERY     sclerotherapy    Current Outpatient Prescriptions  Medication Sig Dispense Refill  . ADVAIR DISKUS 100-50 MCG/DOSE AEPB INHALE 1 PUFF BY MOUTH EVERY DAY 60 each 2  . albuterol (PROAIR HFA) 108 (90 BASE) MCG/ACT inhaler Inhale 2 puffs into the lungs every 6 (six) hours as needed for wheezing. 8.5 g 5  . fluconazole (DIFLUCAN) 150 MG tablet Take 2 tablets (300 mg total) by mouth once a week. 4 tablet 0  . HYDROcodone-acetaminophen (NORCO) 7.5-325 MG tablet Take 1 tablet by mouth every 6 (six) hours as needed for moderate pain. 30 tablet 0  . losartan-hydrochlorothiazide (HYZAAR) 50-12.5 MG tablet TAKE 1 TABLET BY MOUTH DAILY 30 tablet 4   No current facility-administered medications for this visit.      Allergies:   Patient has no known allergies.   Social History:  The patient  reports that he quit smoking about 14 years ago. His smoking use included Cigarettes. He smoked 0.25 packs per day. He has never used smokeless tobacco. He reports that he does not drink alcohol or use drugs.   Family History:   family history includes Cancer in his brother and maternal grandfather; Heart attack (age of onset:  3463) in his father; Heart disease in his mother; Hyperlipidemia in his brother, brother, and brother; Hypertension in his father.    Review of Systems: Review of Systems  Constitutional: Negative.   Respiratory: Negative.   Cardiovascular: Negative.   Gastrointestinal: Negative.   Musculoskeletal: Negative.   Neurological: Negative.   Psychiatric/Behavioral: Negative.   All other systems reviewed and are negative.    PHYSICAL EXAM: VS:  BP 130/90 (BP Location: Left Arm, Patient Position: Sitting,  Cuff Size: Normal)   Pulse 75   Ht 6' (1.829 m)   Wt 253 lb 8 oz (115 kg)   BMI 34.38 kg/m  , BMI Body mass index is 34.38 kg/m. GEN: Well nourished, well developed, in no acute distress  HEENT: normal  Neck: no JVD, carotid bruits, or masses Cardiac: RRR; no murmurs, rubs, or gallops,no edema  Respiratory:  clear to auscultation bilaterally, normal work of breathing GI: soft, nontender, nondistended, + BS MS: no deformity or atrophy  Skin: warm and dry, no rash Neuro:  Strength and sensation are intact Psych: euthymic mood, full affect    Recent Labs: No results found for requested labs within last 8760 hours.    Lipid Panel Lab Results  Component Value Date   CHOL 284 (H) 08/15/2014   HDL 43.40 08/15/2014   LDLCALC 207 (H) 08/15/2014   TRIG 169.0 (H) 08/15/2014      Wt Readings from Last 3 Encounters:  12/28/15 253 lb 8 oz (115 kg)  07/16/15 251 lb 8 oz (114.1 kg)  09/16/14 258 lb 12 oz (117.4 kg)       ASSESSMENT AND PLAN:  Essential hypertension, benign - Plan: EKG 12-Lead Reports having periodic lightheadedness concerning for orthostasis If symptoms do not improve with increased hydration, Would monitor blood pressure at home If numbers appear low, could cut the losartan HCTZ in half daily. Suspect he may have had more issues in the summertime in the hot weather  VARICOSE VEINS, LOWER EXTREMITIES Recommended compression hose Long discussion concerning previous surgical intervention in his thighs  Hypercholesteremia Long discussion concerning his hyperlipidemia CT scan reviewed showing no significant coronary calcifications, PAD. He would like to lose weight, exercise for a period of 6 months, recheck numbers at that time. If cholesterol continues to run high, he would be instituted starting low-dose Crestor.   CT coronary calcium score could be done in the future, 5 or 10 years for risk stratification. Test was discussed with him in detail   Total  encounter time more than 45 minutes  Greater than 50% was spent in counseling and coordination of care with the patient   Disposition:   F/U  as needed   Orders Placed This Encounter  Procedures  . EKG 12-Lead     Signed, Dossie Arbourim Kinser Fellman, M.D., Ph.D. 12/28/2015  [facility], [place] [phone]

## 2016-01-07 ENCOUNTER — Telehealth: Payer: Self-pay | Admitting: Family Medicine

## 2016-01-07 DIAGNOSIS — E7800 Pure hypercholesterolemia, unspecified: Secondary | ICD-10-CM

## 2016-01-07 NOTE — Telephone Encounter (Signed)
-----   Message from Baldomero LamyNatasha C Chavers sent at 01/07/2016  2:38 PM EST ----- Regarding: Cpx labs Mon 11/27, need orders. Thanks! :-) Please order  future cpx labs for pt's upcoming lab appt. Thanks Rodney Boozeasha

## 2016-01-18 ENCOUNTER — Other Ambulatory Visit (INDEPENDENT_AMBULATORY_CARE_PROVIDER_SITE_OTHER): Payer: BLUE CROSS/BLUE SHIELD

## 2016-01-18 DIAGNOSIS — E7800 Pure hypercholesterolemia, unspecified: Secondary | ICD-10-CM | POA: Diagnosis not present

## 2016-01-18 LAB — COMPREHENSIVE METABOLIC PANEL
ALBUMIN: 4.6 g/dL (ref 3.5–5.2)
ALT: 34 U/L (ref 0–53)
AST: 18 U/L (ref 0–37)
Alkaline Phosphatase: 36 U/L — ABNORMAL LOW (ref 39–117)
BUN: 17 mg/dL (ref 6–23)
CHLORIDE: 103 meq/L (ref 96–112)
CO2: 30 mEq/L (ref 19–32)
CREATININE: 1.18 mg/dL (ref 0.40–1.50)
Calcium: 9.7 mg/dL (ref 8.4–10.5)
GFR: 72.96 mL/min (ref >60.00)
Glucose, Bld: 90 mg/dL (ref 70–99)
Potassium: 4.6 mEq/L (ref 3.5–5.1)
Sodium: 140 mEq/L (ref 135–145)
Total Bilirubin: 0.9 mg/dL (ref 0.2–1.2)
Total Protein: 7.7 g/dL (ref 6.0–8.3)

## 2016-01-18 LAB — LDL CHOLESTEROL, DIRECT: Direct LDL: 168 mg/dL

## 2016-01-18 LAB — LIPID PANEL
CHOL/HDL RATIO: 9
Cholesterol: 258 mg/dL — ABNORMAL HIGH (ref 0–200)
HDL: 28.9 mg/dL — ABNORMAL LOW (ref >39.00)
NONHDL: 228.79
Triglycerides: 309 mg/dL — ABNORMAL HIGH (ref 0.0–149.0)
VLDL: 61.8 mg/dL — ABNORMAL HIGH (ref 0.0–40.0)

## 2016-01-22 ENCOUNTER — Ambulatory Visit (INDEPENDENT_AMBULATORY_CARE_PROVIDER_SITE_OTHER): Payer: Self-pay | Admitting: Family Medicine

## 2016-01-22 ENCOUNTER — Encounter: Payer: Self-pay | Admitting: Family Medicine

## 2016-01-22 VITALS — BP 100/80 | HR 95 | Temp 98.3°F | Ht 74.25 in | Wt 241.5 lb

## 2016-01-22 DIAGNOSIS — J4540 Moderate persistent asthma, uncomplicated: Secondary | ICD-10-CM

## 2016-01-22 DIAGNOSIS — I10 Essential (primary) hypertension: Secondary | ICD-10-CM

## 2016-01-22 DIAGNOSIS — E7800 Pure hypercholesterolemia, unspecified: Secondary | ICD-10-CM

## 2016-01-22 DIAGNOSIS — Z0000 Encounter for general adult medical examination without abnormal findings: Secondary | ICD-10-CM

## 2016-01-22 DIAGNOSIS — M25511 Pain in right shoulder: Secondary | ICD-10-CM

## 2016-01-22 DIAGNOSIS — Z23 Encounter for immunization: Secondary | ICD-10-CM

## 2016-01-22 DIAGNOSIS — G8929 Other chronic pain: Secondary | ICD-10-CM

## 2016-01-22 MED ORDER — TETANUS-DIPHTH-ACELL PERTUSSIS 5-2.5-18.5 LF-MCG/0.5 IM SUSP: 0.5000 mL | Freq: Once | INTRAMUSCULAR | Status: DC

## 2016-01-22 MED ORDER — TRAMADOL HCL 50 MG PO TABS: 50.0000 mg | ORAL_TABLET | Freq: Three times a day (TID) | ORAL | 0 refills | Status: DC | PRN

## 2016-01-22 MED ORDER — ROSUVASTATIN CALCIUM 5 MG PO TABS: 5.0000 mg | ORAL_TABLET | Freq: Every day | ORAL | 3 refills | Status: DC

## 2016-01-22 MED ORDER — LOSARTAN POTASSIUM 25 MG PO TABS: 25.0000 mg | ORAL_TABLET | Freq: Every day | ORAL | 11 refills | Status: DC

## 2016-01-22 NOTE — Assessment & Plan Note (Signed)
Stable control on minimal use advair every 4 days. Albuterol prn.

## 2016-01-22 NOTE — Patient Instructions (Addendum)
Change to losartan daily without the HCTZ. Follow BP at home.. Call if increasing > 140/90 or if lightheadness return with further weight loss.  Continue exercise, weight loss, healthy eating habits.  Decrease mayo, cheese, cream, butter, eggs.. Any animal fat. Start low dose crestor daily to every few days.   For shoulder pain use tramadol prn.

## 2016-01-22 NOTE — Progress Notes (Signed)
Pre visit review using our clinic review tool, if applicable. No additional management support is needed unless otherwise documented below in the visit note. 

## 2016-01-22 NOTE — Progress Notes (Signed)
Subjective:    Patient ID: Aaron Tyler, male    DOB: 03/08/1975, 40 y.o.   MRN: [id number]  HPI  The patient is here for annual wellness exam and preventative care.    Hypertension:   Good control on 1/2 tab losartan/ HCTZ.[name]. Using every third day because when on daily lightheaded BP Readings from Last 3 Encounters:  01/22/16 100/80  12/28/15 130/90  07/16/15 120/90  Using medication without problems or lightheadedness:  Yes Chest pain with exertion: none Edema: none Short of breath: none Average home BPs:100s/60s Other issues:  Saw Dr. Mariah MillingGollan, cardiology.   He has decrease red meat, pork and fried food. Lab Results  Component Value Date   CHOL 258 (H) 01/18/2016   HDL 28.90 (L) 01/18/2016   LDLCALC 207 (H) 08/15/2014   LDLDIRECT 168.0 01/18/2016   TRIG 309.0 (H) 01/18/2016   CHOLHDL 9 01/18/2016     Has lost 26 lbs. Wt Readings from Last 3 Encounters:  01/22/16 241 lb 8 oz (109.5 kg)  12/28/15 253 lb 8 oz (115 kg)  07/16/15 251 lb 8 oz (114.1 kg)    Has lost weight.   Asthma, mod persistent,.Allergic rhinitis Symptoms are well controlled: well controlled Using medications without problems: On advair 100/50.[name]. Using once every 4 days, albuterol prn (uses when sick) Night time symptoms:  occ Wheeze/SOB: none ER visits since last visit:None Missed work or school: none Allergens:  Currently with cough with allergies after mowing lawn    .  Indication for chronic opioid:  Right shoulder pain Medication and dose: hydrocodone actetominophen 7.5 mg/325 mg   Went through PT rehab.. Minimal pain at this point. # pills per month:  Uses about 1 time a week     Elevated Cholesterol: reviewed in deraitl Using medications without problems: None Muscle aches: None Diet compliance: Good Exercise: daily Other complaints:   Review of Systems  Constitutional: Negative for fatigue and fever.  HENT: Negative for ear pain.   Eyes: Negative for pain.  Respiratory:  Negative for cough and shortness of breath.   Cardiovascular: Negative for chest pain, palpitations and leg swelling.  Gastrointestinal: Negative for abdominal pain.  Genitourinary: Negative for dysuria.  Musculoskeletal: Negative for arthralgias.  Neurological: Negative for syncope, light-headedness and headaches.  Psychiatric/Behavioral: Negative for dysphoric mood.       Objective:   Physical Exam  Constitutional: He appears well-developed and well-nourished.  Non-toxic appearance. He does not appear ill. No distress.  HENT:  Head: Normocephalic and atraumatic.  Right Ear: Hearing, tympanic membrane, external ear and ear canal normal.  Left Ear: Hearing, tympanic membrane, external ear and ear canal normal.  Nose: Nose normal.  Mouth/Throat: Uvula is midline, oropharynx is clear and moist and mucous membranes are normal.  Eyes: Conjunctivae, EOM and lids are normal. Pupils are equal, round, and reactive to light. Lids are everted and swept, no foreign bodies found.  Neck: Trachea normal, normal range of motion and phonation normal. Neck supple. Carotid bruit is not present. No thyroid mass and no thyromegaly present.  Cardiovascular: Normal rate, regular rhythm, S1 normal, S2 normal, intact distal pulses and normal pulses.  Exam reveals no gallop.   No murmur heard. Pulmonary/Chest: Breath sounds normal. He has no wheezes. He has no rhonchi. He has no rales.  Abdominal: Soft. Normal appearance and bowel sounds are normal. There is no hepatosplenomegaly. There is no tenderness. There is no rebound, no guarding and no CVA tenderness. No hernia.  Genitourinary: Prostate is  not enlarged.  Lymphadenopathy:    He has no cervical adenopathy.  Neurological: He is alert. He has normal strength and normal reflexes. No cranial nerve deficit or sensory deficit. Gait normal.  Skin: Skin is warm, dry and intact. No rash noted.  Psychiatric: He has a normal mood and affect. His speech is normal and  behavior is normal. Judgment normal.          Assessment & Plan:  The patient's preventative maintenance and recommended screening tests for an annual wellness exam were reviewed in full today. Brought up to date unless services declined.  Counselled on the importance of diet, exercise, and its role in overall health and mortality. The patient's FH and SH was reviewed, including their home life, tobacco status, and drug and alcohol status.      Vaccines: Tdap due, PNA uptodate, flu refused  Prostate Cancer Screen: no early family hx Colon Cancer Screen:no early family hx      Smoking Status: none  HIV screen:   refused

## 2016-01-22 NOTE — Addendum Note (Signed)
Addended by: Damita LackLORING, DONNA S on: 01/22/2016 12:28 PM   Modules accepted: Orders

## 2016-01-22 NOTE — Assessment & Plan Note (Signed)
Much improved after PT. Will try to decreased med to tramadol for flares of pain instead of narcotic.

## 2016-01-22 NOTE — Assessment & Plan Note (Signed)
Given weight loss.. Decrease losartan to 25 mg daily and eliminate HCTZ. Follow BP closely

## 2016-01-22 NOTE — Assessment & Plan Note (Signed)
Pt has improved LDL but remains concerned about risk given CAD in father.. Will start lose dose crestor daily. Encouraged exercise, weight loss, healthy eating habits.

## 2016-03-16 ENCOUNTER — Other Ambulatory Visit: Payer: Self-pay

## 2016-03-16 MED ORDER — ALBUTEROL SULFATE HFA 108 (90 BASE) MCG/ACT IN AERS: 2.0000 | INHALATION_SPRAY | Freq: Four times a day (QID) | RESPIRATORY_TRACT | 5 refills | Status: DC | PRN

## 2016-04-15 ENCOUNTER — Telehealth: Payer: Self-pay | Admitting: Family Medicine

## 2016-04-15 DIAGNOSIS — E7800 Pure hypercholesterolemia, unspecified: Secondary | ICD-10-CM

## 2016-04-15 NOTE — Telephone Encounter (Signed)
-----   Message from Alvina Chouerri J Walsh sent at 04/12/2016  3:49 PM EST ----- Regarding: Lab orders for Thursday, 3.1.18 Lab orders for f/u labs, no appt

## 2016-04-21 ENCOUNTER — Other Ambulatory Visit: Payer: BLUE CROSS/BLUE SHIELD

## 2016-08-12 ENCOUNTER — Other Ambulatory Visit: Payer: Self-pay

## 2016-08-12 MED ORDER — FLUTICASONE-SALMETEROL 100-50 MCG/DOSE IN AEPB: INHALATION_SPRAY | RESPIRATORY_TRACT | 2 refills | Status: DC

## 2016-08-12 NOTE — Telephone Encounter (Signed)
Wife left message on vm for refill of Advair for 90 days. I sent in the refill.

## 2016-08-25 ENCOUNTER — Other Ambulatory Visit: Payer: Self-pay | Admitting: Family Medicine

## 2016-08-29 ENCOUNTER — Telehealth: Payer: Self-pay

## 2016-08-29 NOTE — Telephone Encounter (Signed)
Vernona RiegerLaura (DPR signed) left v/m requesting Losartan 25 mg be filled and not the losartan HCTZ; [facility] [address] and he will d/c Losartan HCTZ and fill losartan 25 mg. I left detailed v/m per DPR for laura.

## 2017-01-08 IMAGING — MR MR SHOULDER*R* W/CM
4 of 6 series · 24 of 40 positions shown · IV contrast (agent unspecified)
Comparison: None.

CLINICAL DATA: Right shoulder pain, limited range of motion and
weakness for 5 years.

EXAM:
MR ARTHROGRAM OF THE RIGHT SHOULDER
TECHNIQUE: Multiplanar, multisequence MR imaging of the right shoulder was
performed following the administration of intra-articular contrast.
CONTRAST:  See Injection Documentation.

[Series 2: T1 fat-sat · axial · 4.0mm · 0.55mm/px · z∈[-33,+67]mm · 8 of 22 slices shown (1 of 2)]
[im 1/22]
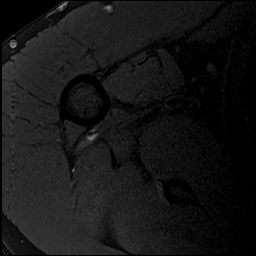
[im 4/22]
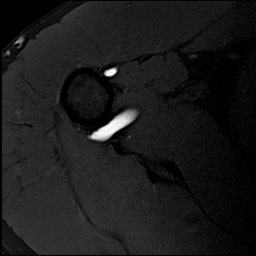
[im 7/22]
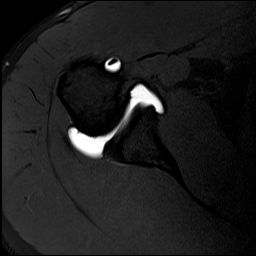
[im 10/22]
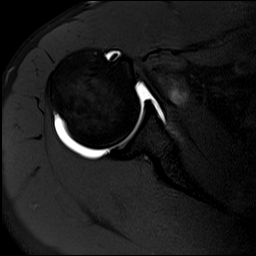
[im 13/22]
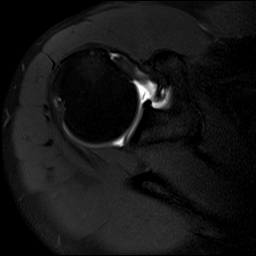
[im 16/22]
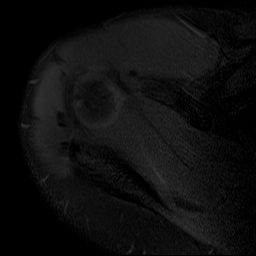
[im 19/22]
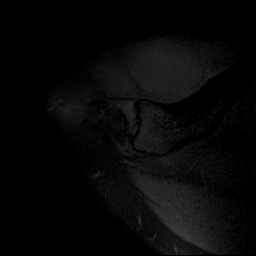
[im 22/22]
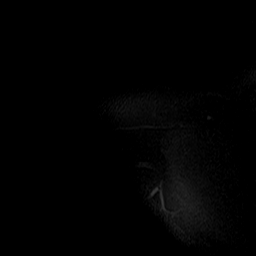

[Series 3: T1 fat-sat · oblique · 4.0mm · 0.22mm/px · 6 of 18 slices shown (2 of 2)]
[im 1/18]
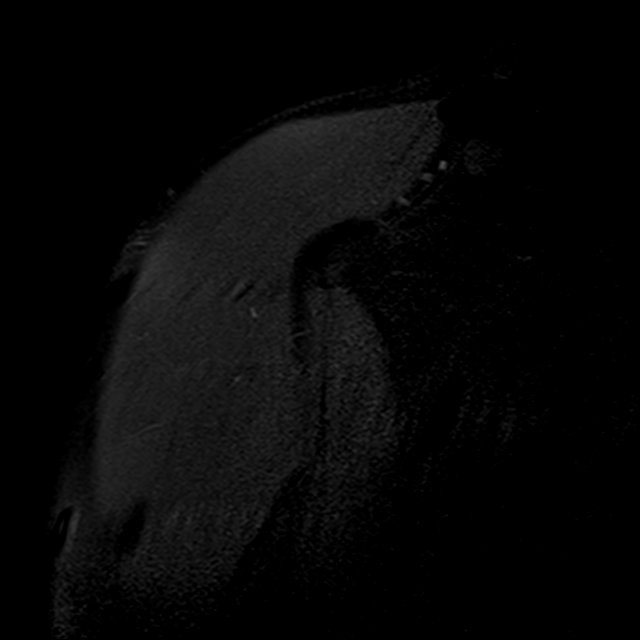
[im 3/18]
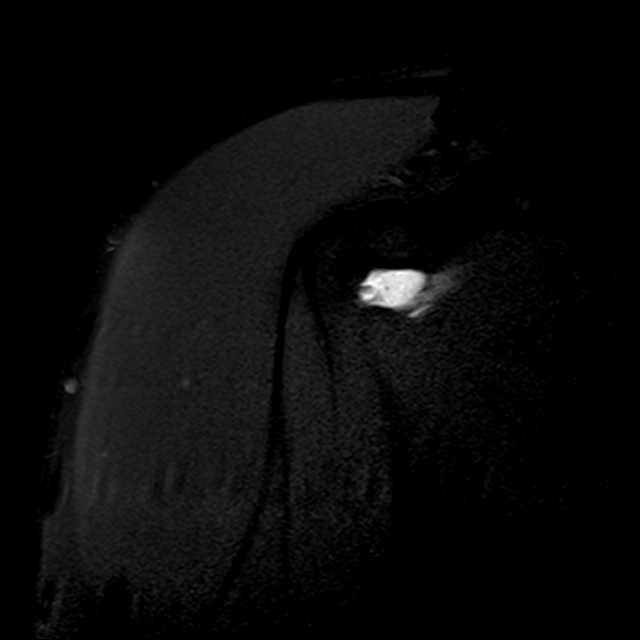
[im 6/18]
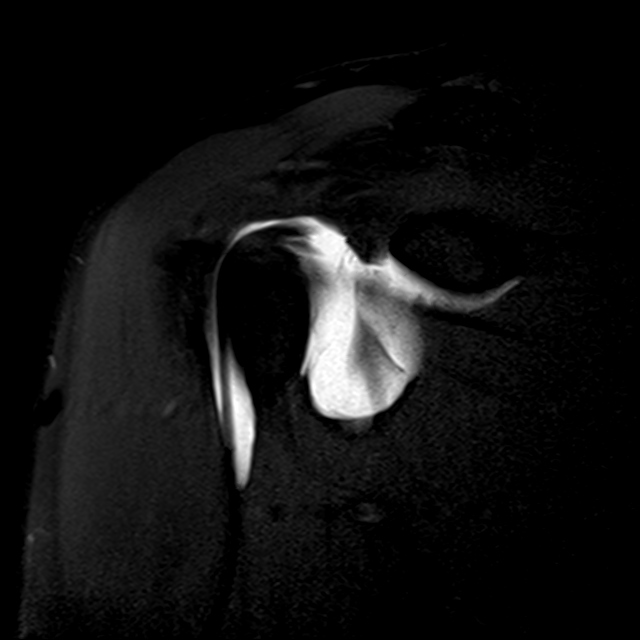
[im 9/18]
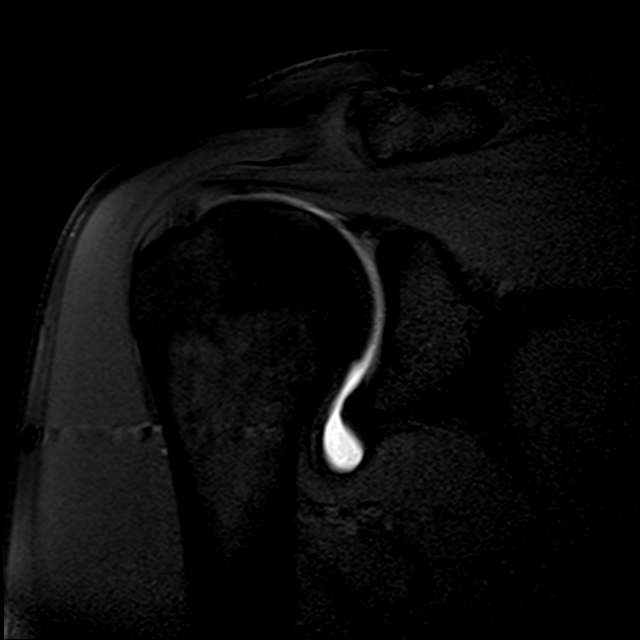
[im 12/18]
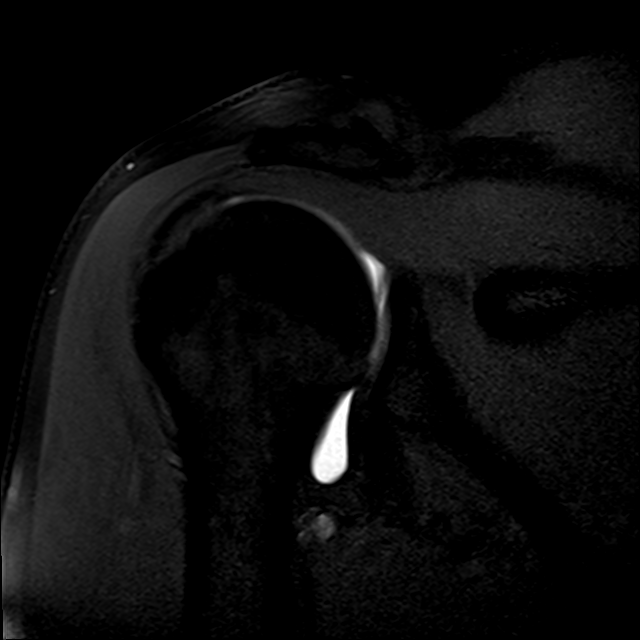
[im 15/18]
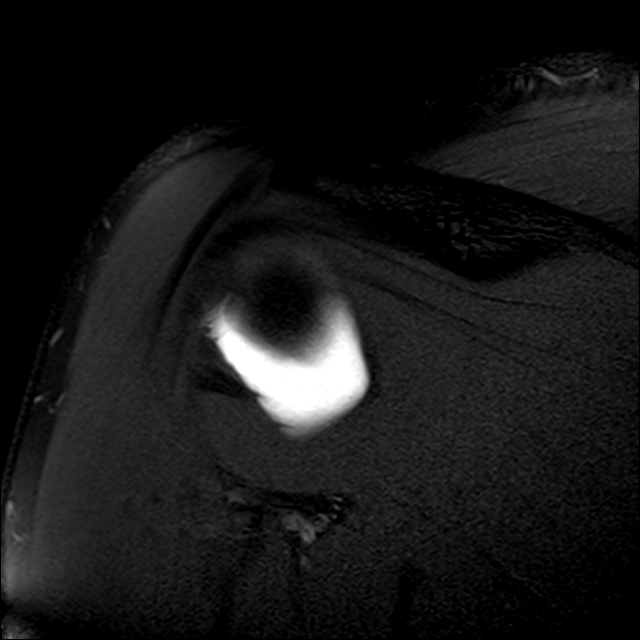

[Series 4: T1 · oblique · 4.0mm · 0.22mm/px · 3 of 18 slices shown]
[im 4/18]
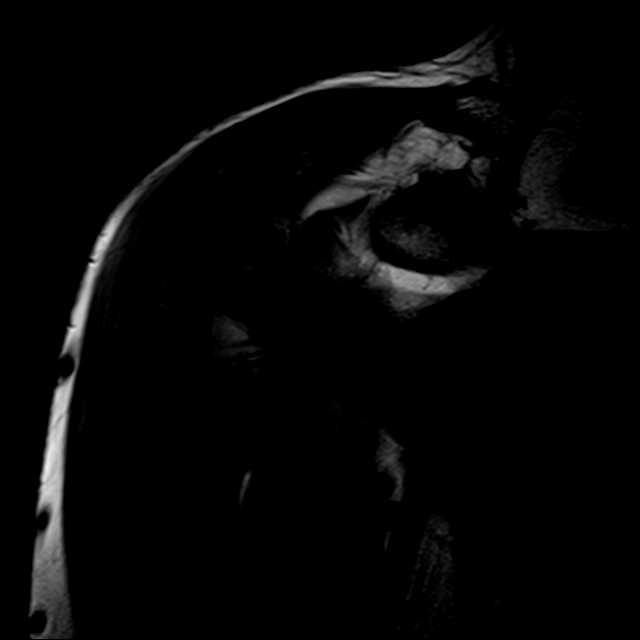
[im 11/18]
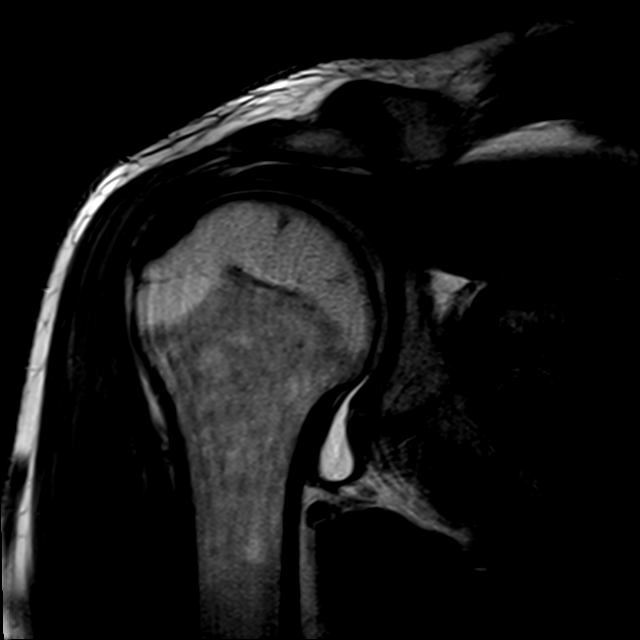
[im 18/18]
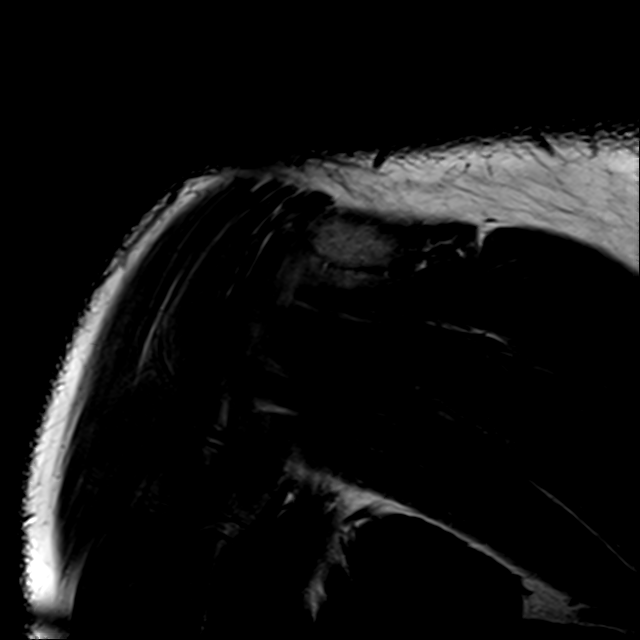

[Series 5: T2 fat-sat · oblique · 4.0mm · 0.44mm/px · 7 of 22 slices shown]
[im 1/22]
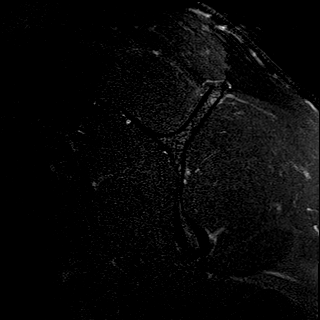
[im 4/22]
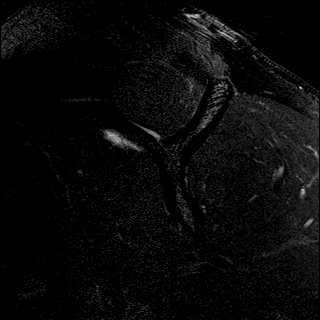
[im 8/22]
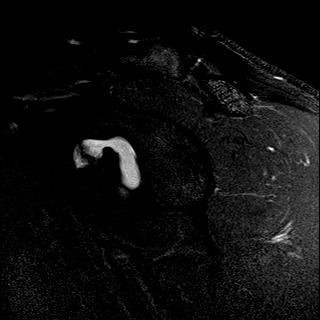
[im 11/22]
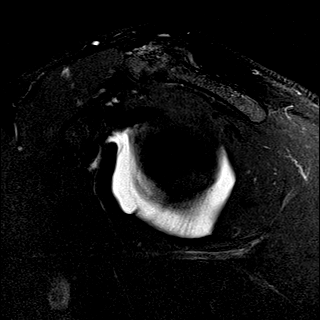
[im 15/22]
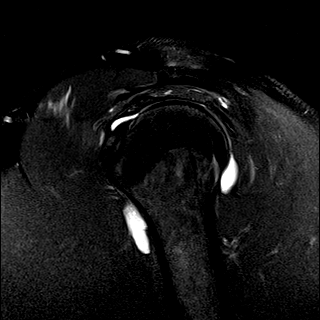
[im 18/22]
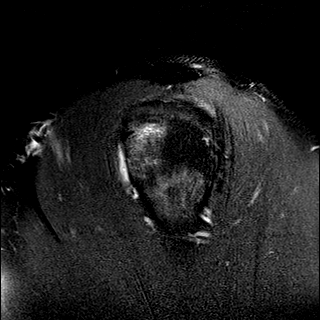
[im 22/22]
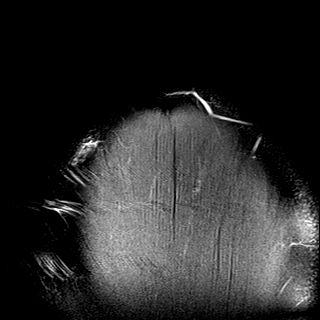

[24 of 40 positions shown; findings below may reference images not displayed]

FINDINGS: Rotator cuff: Intact. Mildly increased T2 signal in the
supraspinatus and infraspinatus tendons consistent with tendinopathy
is noted.

Muscles: No atrophy or focal lesion.  Normal in appearance.

Biceps long head: Intact.

Acromioclavicular Joint: Moderate acromioclavicular osteoarthritis
is identified and results in some mass effect on the supraspinatus.

Glenohumeral Joint: Unremarkable.

Labrum: A linear hypointense focus along the posterior, inferior
glenoid is most consistent with calcification and [place] lesion.
No labral tear is identified.

Bones: The acromion is type 2. There is no fracture or worrisome
marrow lesion.
IMPRESSION: Mild appearing supraspinatus and infraspinatus tendinopathy without
tear.

Moderate appearing acromioclavicular degenerative change results in
mild mass effect on the supraspinatus.

Linear calcification of the posterior, inferior glenoid is
compatible with Kentarou Carranza [place] lesion. No labral tear is identified.

## 2017-02-28 ENCOUNTER — Other Ambulatory Visit: Payer: Self-pay | Admitting: Family Medicine

## 2017-03-31 ENCOUNTER — Encounter: Payer: Self-pay | Admitting: Family Medicine

## 2017-03-31 ENCOUNTER — Other Ambulatory Visit: Payer: Self-pay

## 2017-03-31 ENCOUNTER — Ambulatory Visit: Payer: Self-pay | Admitting: Family Medicine

## 2017-03-31 VITALS — BP 142/98 | HR 86 | Temp 97.7°F | Ht 74.25 in | Wt 246.2 lb

## 2017-03-31 DIAGNOSIS — E7800 Pure hypercholesterolemia, unspecified: Secondary | ICD-10-CM

## 2017-03-31 DIAGNOSIS — I10 Essential (primary) hypertension: Secondary | ICD-10-CM

## 2017-03-31 DIAGNOSIS — M542 Cervicalgia: Secondary | ICD-10-CM

## 2017-03-31 DIAGNOSIS — G5602 Carpal tunnel syndrome, left upper limb: Secondary | ICD-10-CM

## 2017-03-31 MED ORDER — ROSUVASTATIN CALCIUM 5 MG PO TABS: 5.0000 mg | ORAL_TABLET | Freq: Every day | ORAL | 2 refills | Status: DC

## 2017-03-31 MED ORDER — DICLOFENAC SODIUM 75 MG PO TBEC: 75.0000 mg | DELAYED_RELEASE_TABLET | Freq: Two times a day (BID) | ORAL | 0 refills | Status: DC

## 2017-03-31 MED ORDER — ALBUTEROL SULFATE HFA 108 (90 BASE) MCG/ACT IN AERS: 2.0000 | INHALATION_SPRAY | Freq: Four times a day (QID) | RESPIRATORY_TRACT | 5 refills | Status: DC | PRN

## 2017-03-31 MED ORDER — LOSARTAN POTASSIUM 25 MG PO TABS: ORAL_TABLET | ORAL | 2 refills | Status: DC

## 2017-03-31 MED ORDER — FLUTICASONE-SALMETEROL 100-50 MCG/DOSE IN AEPB: INHALATION_SPRAY | RESPIRATORY_TRACT | 2 refills | Status: DC

## 2017-03-31 NOTE — Assessment & Plan Note (Signed)
Treat with brace at night x 2-4 weeks.

## 2017-03-31 NOTE — Assessment & Plan Note (Signed)
Well controlled at home. Well controlled. Continue current medication.

## 2017-03-31 NOTE — Assessment & Plan Note (Signed)
Due for re-eval. 

## 2017-03-31 NOTE — Assessment & Plan Note (Signed)
Possible cervical radiculopathy.  Treat with NSAIDS.. If not improving eval with X-ray etc.

## 2017-03-31 NOTE — Progress Notes (Signed)
   Subjective:    Patient ID: Aaron NewcomerJohn R Tyler, male    DOB: 10/31/1975, 42 y.o.   MRN: [id number]  HPI   89 yea rold male presents for follow up HTn and high cholesterol.  Hypertension:  Poor control  Today.  Good at home. BP Readings from Last 3 Encounters:  03/31/17 (!) 142/98  01/22/16 100/80  12/28/15 130/90  Using medication without problems or lightheadedness:  none Chest pain with exertion:none Edema: none Short of breath: none Average home BPs: good control at home 132/86 Other issues: some increase in stress.   no longer requiring advair.  Elevated Cholesterol:  On crestor.. Need refill Using medications without problems: Muscle aches: none Diet compliance: good Exercise: good Other complaints: Body mass index is 31.4 kg/m. Wt Readings from Last 3 Encounters:  03/31/17 246 lb 4 oz (111.7 kg)  01/22/16 241 lb 8 oz (109.5 kg)  12/28/15 253 lb 8 oz (115 kg)      He has been having several months of pain in left upper back, radiates to left neck. Triggered with bad posture. Lifting weights, but not different.  No fall, no new injury.  Tingling in  Radial side of hand. No weakness.  using tyelnol and advil prn.  Swimming some.  Blood pressure (!) 142/98, pulse 86, temperature 97.7 F (36.5 C), temperature source Oral, height 6' 2.25" (1.886 m), weight 246 lb 4 oz (111.7 kg).   Review of Systems     Objective:   Physical Exam  Constitutional: Vital signs are normal. He appears well-developed and well-nourished.  HENT:  Head: Normocephalic.  Right Ear: Hearing normal.  Left Ear: Hearing normal.  Nose: Nose normal.  Mouth/Throat: Oropharynx is clear and moist and mucous membranes are normal.  Neck: Trachea normal. Carotid bruit is not present. No thyroid mass and no thyromegaly present.  Cardiovascular: Normal rate, regular rhythm and normal pulses. Exam reveals no gallop, no distant heart sounds and no friction rub.  No murmur heard. No peripheral edema    Pulmonary/Chest: Effort normal and breath sounds normal. No respiratory distress.  Musculoskeletal:       Right shoulder: Normal.       Left shoulder: He exhibits tenderness. He exhibits normal range of motion and no bony tenderness.       Cervical back: He exhibits tenderness. He exhibits normal range of motion and no bony tenderness.  Positive spurling on left  ttp subacromially on left shoulder  pain with Neer;s on left and pain   positive phalen and tinel on left   Skin: Skin is warm, dry and intact. No rash noted.  Psychiatric: He has a normal mood and affect. His speech is normal and behavior is normal. Thought content normal.          Assessment & Plan:

## 2017-03-31 NOTE — Patient Instructions (Addendum)
Follow blood pressure at home.. Call if persistently > 140/90. Work on [employer] eating, regular exercise. Wear carpal tunnel brace on left arm at nigh for the next 2-4 weeks.  Start diclofenac twice daily for pain and inflammation x 2 weeks.  Call or follow up if not improving as expected for further evaluation

## 2017-05-12 ENCOUNTER — Other Ambulatory Visit: Payer: Self-pay

## 2017-05-12 ENCOUNTER — Telehealth: Payer: Self-pay | Admitting: Family Medicine

## 2017-05-12 MED ORDER — FLUTICASONE-SALMETEROL 100-50 MCG/DOSE IN AEPB: INHALATION_SPRAY | RESPIRATORY_TRACT | 2 refills | Status: DC

## 2017-05-12 NOTE — Telephone Encounter (Signed)
Copied from CRM [id number]. Topic: Quick Communication - See Telephone Encounter >> May 12, 2017  1:06 PM Landry MellowFoltz, Melissa J wrote: CRM for notification. See Telephone encounter for: 05/12/17. Wife called - she says [facility] that [facility] did not receive rx for Fluticasone-Salmeterol (ADVAIR DISKUS) 100-50 MCG/DOSE AEPB. Is asking to [facility] [place] [address]

## 2017-05-15 ENCOUNTER — Other Ambulatory Visit: Payer: Self-pay | Admitting: Family Medicine

## 2017-05-23 ENCOUNTER — Telehealth: Payer: Self-pay | Admitting: Family Medicine

## 2017-05-23 ENCOUNTER — Other Ambulatory Visit: Payer: Self-pay

## 2017-05-23 DIAGNOSIS — E7800 Pure hypercholesterolemia, unspecified: Secondary | ICD-10-CM

## 2017-05-23 NOTE — Telephone Encounter (Signed)
-----   Message from Wendi MayaLauren Greeson, RT sent at 05/17/2017  3:17 PM EDT ----- Regarding: Lab orders for Tuesday April 2nd Please enter CPX lab orders for next Tuesday April 2nd Thanks-Lauren

## 2017-05-30 ENCOUNTER — Encounter: Payer: Self-pay | Admitting: Family Medicine

## 2017-06-02 ENCOUNTER — Other Ambulatory Visit: Payer: Self-pay | Admitting: Family Medicine

## 2017-07-18 ENCOUNTER — Other Ambulatory Visit: Payer: Self-pay

## 2017-07-20 ENCOUNTER — Encounter: Payer: Self-pay | Admitting: Family Medicine

## 2017-08-29 ENCOUNTER — Other Ambulatory Visit: Payer: Self-pay

## 2017-09-05 ENCOUNTER — Encounter: Payer: Self-pay | Admitting: Family Medicine

## 2017-09-05 ENCOUNTER — Ambulatory Visit (INDEPENDENT_AMBULATORY_CARE_PROVIDER_SITE_OTHER): Payer: No Typology Code available for payment source | Admitting: Family Medicine

## 2017-09-05 VITALS — BP 132/88 | HR 98 | Temp 98.2°F | Ht 74.0 in | Wt 243.0 lb

## 2017-09-05 DIAGNOSIS — M25511 Pain in right shoulder: Secondary | ICD-10-CM | POA: Diagnosis not present

## 2017-09-05 DIAGNOSIS — E7800 Pure hypercholesterolemia, unspecified: Secondary | ICD-10-CM | POA: Diagnosis not present

## 2017-09-05 DIAGNOSIS — J4540 Moderate persistent asthma, uncomplicated: Secondary | ICD-10-CM

## 2017-09-05 DIAGNOSIS — Z0000 Encounter for general adult medical examination without abnormal findings: Secondary | ICD-10-CM | POA: Diagnosis not present

## 2017-09-05 DIAGNOSIS — G8929 Other chronic pain: Secondary | ICD-10-CM

## 2017-09-05 DIAGNOSIS — I10 Essential (primary) hypertension: Secondary | ICD-10-CM

## 2017-09-05 NOTE — Progress Notes (Signed)
Subjective:    Patient ID: Aaron Tyler, male    DOB: 10/20/1975, 42 y.o.   MRN: [id number]  HPI  The patient is here for annual wellness exam and preventative care.    Hypertension:   Has not been taking losartan .[name] Has been using apple cider vinegar BP Readings from Last 3 Encounters:  09/05/17 132/88  03/31/17 (!) 142/98  01/22/16 100/80  Using medication without problems or lightheadedness:  none Chest pain with exertion:none Edema: none Short of breath:none Average home BPs: 120/89- 125/86 Other issues: Wt Readings from Last 3 Encounters:  09/05/17 243 lb (110.2 kg)  03/31/17 246 lb 4 oz (111.7 kg)  01/22/16 241 lb 8 oz (109.5 kg)  Body mass index is 31.2 kg/m.   Elevated Cholesterol:  Due for re-eval. No longer on crestor 5 mg daily using fish oil. Lab Results  Component Value Date   CHOL 258 (H) 01/18/2016   HDL 28.90 (L) 01/18/2016   LDLCALC 207 (H) 08/15/2014   LDLDIRECT 168.0 01/18/2016   TRIG 309.0 (H) 01/18/2016   CHOLHDL 9 01/18/2016  Using medications without problems: Muscle aches:  Diet compliance: healthy, limiting fried foods, stopped red meat. Exercise: walking 3 times a week Other complaints:   Using diclofenac for chronic shoulder pain related to baseball injury.  Diclofenac does not help with his pain at all.   Moderate persistent asthma: Symptoms are well controlled: well controlled now, worse in allergy symptoms Using medications without problems: On advair 100/50.[name] Using once a weeks, albuterol prn (uses when sick) Night time symptoms:  none Wheeze/SOB: none ER visits since last visit:None Missed work or school: none Allergens:  none   Social History /Family History/Past Medical History reviewed in detail and updated in EMR if needed. Blood pressure 132/88, pulse 98, temperature 98.2 F (36.8 C), temperature source Oral, height 6\' 2"  (1.88 m), weight 243 lb (110.2 kg), SpO2 96 %.  Review of Systems  Constitutional: Negative for  fatigue, fever and unexpected weight change.  HENT: Negative for congestion, ear pain, postnasal drip, rhinorrhea, sore throat and trouble swallowing.   Eyes: Negative for pain.  Respiratory: Negative for cough, shortness of breath and wheezing.   Cardiovascular: Negative for chest pain, palpitations and leg swelling.  Gastrointestinal: Negative for abdominal pain, blood in stool, constipation, diarrhea and nausea.  Genitourinary: Negative for difficulty urinating, discharge, dysuria, hematuria, penile pain, penile swelling, scrotal swelling, testicular pain and urgency.  Skin: Negative for rash.  Neurological: Negative for syncope, weakness, light-headedness, numbness and headaches.  Psychiatric/Behavioral: Negative for behavioral problems and dysphoric mood. The patient is not nervous/anxious.        Objective:   Physical Exam  Constitutional: He appears well-developed and well-nourished.  Non-toxic appearance. He does not appear ill. No distress.  HENT:  Head: Normocephalic and atraumatic.  Right Ear: Hearing, tympanic membrane, external ear and ear canal normal.  Left Ear: Hearing, tympanic membrane, external ear and ear canal normal.  Nose: Nose normal.  Mouth/Throat: Uvula is midline, oropharynx is clear and moist and mucous membranes are normal.  Eyes: Pupils are equal, round, and reactive to light. Conjunctivae, EOM and lids are normal. Lids are everted and swept, no foreign bodies found.  Neck: Trachea normal, normal range of motion and phonation normal. Neck supple. Carotid bruit is not present. No thyroid mass and no thyromegaly present.  Cardiovascular: Normal rate, regular rhythm, S1 normal, S2 normal, intact distal pulses and normal pulses. Exam reveals no gallop.  No murmur  heard. Pulmonary/Chest: Breath sounds normal. He has no wheezes. He has no rhonchi. He has no rales.  Abdominal: Soft. Normal appearance and bowel sounds are normal. There is no hepatosplenomegaly. There  is no tenderness. There is no rebound, no guarding and no CVA tenderness. No hernia.  Lymphadenopathy:    He has no cervical adenopathy.  Neurological: He is alert. He has normal strength and normal reflexes. No cranial nerve deficit or sensory deficit. Gait normal.  Skin: Skin is warm, dry and intact. No rash noted.  Psychiatric: He has a normal mood and affect. His speech is normal and behavior is normal. Judgment normal.          Assessment & Plan:  The patient's preventative maintenance and recommended screening tests for an annual wellness exam were reviewed in full today. Brought up to date unless services declined.  Counselled on the importance of diet, exercise, and its role in overall health and mortality. The patient's FH and SH was reviewed, including their home life, tobacco status, and drug and alcohol status.   Vaccines: Tdap due, PNA uptodate, flu refused  Prostate Cancer Screen: no early family hx Colon Cancer Screen:no early family hx      Smoking Status: none HIV screen:  refused  ETOH: occ  no drug use.

## 2017-09-05 NOTE — Addendum Note (Signed)
Addended by: Alvina ChouWALSH, TERRI J on: 09/05/2017 04:21 PM   Modules accepted: Orders

## 2017-09-05 NOTE — Assessment & Plan Note (Signed)
Diclofenac did not help, will try trial of glucosamine.  [facility]

## 2017-09-05 NOTE — Assessment & Plan Note (Signed)
Due for re-eval.. OFF med.. May need to restart.

## 2017-09-05 NOTE — Assessment & Plan Note (Signed)
Well controlled. Continue current medication.  

## 2017-09-05 NOTE — Assessment & Plan Note (Signed)
Good control off medication. Follow close and restart losartan if BP>140./90. Encouraged exercise, weight loss, healthy eating habits.

## 2017-09-05 NOTE — Patient Instructions (Addendum)
Please stop at the lab to have labs drawn.  Restart BP med if  >140/90.  Can try glucosamine 500 mg 1-3 times daily.

## 2017-09-06 LAB — COMPREHENSIVE METABOLIC PANEL
ALBUMIN: 4.7 g/dL (ref 3.5–5.2)
ALK PHOS: 37 U/L — AB (ref 39–117)
ALT: 34 U/L (ref 0–53)
AST: 20 U/L (ref 0–37)
BUN: 14 mg/dL (ref 6–23)
CHLORIDE: 103 meq/L (ref 96–112)
CO2: 30 mEq/L (ref 19–32)
CREATININE: 1.27 mg/dL (ref 0.40–1.50)
Calcium: 9.8 mg/dL (ref 8.4–10.5)
GFR: 66.47 mL/min (ref >60.00)
GLUCOSE: 79 mg/dL (ref 70–99)
POTASSIUM: 4.4 meq/L (ref 3.5–5.1)
SODIUM: 141 meq/L (ref 135–145)
TOTAL PROTEIN: 7.7 g/dL (ref 6.0–8.3)
Total Bilirubin: 1.4 mg/dL — ABNORMAL HIGH (ref 0.2–1.2)

## 2017-09-06 LAB — LDL CHOLESTEROL, DIRECT: LDL DIRECT: 168 mg/dL

## 2017-09-06 LAB — LIPID PANEL
Cholesterol: 251 mg/dL — ABNORMAL HIGH (ref 0–200)
HDL: 44.3 mg/dL (ref >39.00)
NONHDL: 206.25
Total CHOL/HDL Ratio: 6
Triglycerides: 262 mg/dL — ABNORMAL HIGH (ref 0.0–149.0)
VLDL: 52.4 mg/dL — ABNORMAL HIGH (ref 0.0–40.0)

## 2017-12-12 ENCOUNTER — Telehealth: Payer: Self-pay | Admitting: Family Medicine

## 2017-12-12 DIAGNOSIS — Z8249 Family history of ischemic heart disease and other diseases of the circulatory system: Secondary | ICD-10-CM

## 2017-12-12 DIAGNOSIS — I10 Essential (primary) hypertension: Secondary | ICD-10-CM

## 2017-12-12 NOTE — Telephone Encounter (Signed)
Copied from CRM [id number]. Topic: Referral - Question >> Dec 12, 2017 12:41 PM Stephannie Li, NT wrote: Reason for CRM: Patient says he went to a cardiologist a few years ago and does not remember if he had to have referral and would  like to see the same one if possible , please call the patients wife at [phone]  ok to leave a message

## 2018-01-10 ENCOUNTER — Ambulatory Visit: Payer: No Typology Code available for payment source | Admitting: Nurse Practitioner

## 2018-01-24 ENCOUNTER — Ambulatory Visit: Payer: No Typology Code available for payment source | Admitting: Nurse Practitioner

## 2018-01-25 ENCOUNTER — Other Ambulatory Visit: Payer: Self-pay

## 2018-01-25 DIAGNOSIS — I8390 Asymptomatic varicose veins of unspecified lower extremity: Secondary | ICD-10-CM

## 2018-02-08 ENCOUNTER — Inpatient Hospital Stay (HOSPITAL_COMMUNITY): Admission: RE | Admit: 2018-02-08 | Payer: No Typology Code available for payment source | Source: Ambulatory Visit

## 2018-02-08 ENCOUNTER — Encounter: Payer: No Typology Code available for payment source | Admitting: Vascular Surgery

## 2018-02-09 ENCOUNTER — Encounter: Payer: Self-pay | Admitting: Nurse Practitioner

## 2018-02-09 ENCOUNTER — Ambulatory Visit (INDEPENDENT_AMBULATORY_CARE_PROVIDER_SITE_OTHER): Payer: No Typology Code available for payment source | Admitting: Nurse Practitioner

## 2018-02-09 VITALS — BP 130/100 | HR 82 | Ht 76.0 in | Wt 247.0 lb

## 2018-02-09 DIAGNOSIS — R0789 Other chest pain: Secondary | ICD-10-CM | POA: Diagnosis not present

## 2018-02-09 DIAGNOSIS — I10 Essential (primary) hypertension: Secondary | ICD-10-CM | POA: Diagnosis not present

## 2018-02-09 MED ORDER — LOSARTAN POTASSIUM 25 MG PO TABS: 50.0000 mg | ORAL_TABLET | Freq: Every day | ORAL | 3 refills | Status: DC
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
## 2018-02-09 NOTE — Patient Instructions (Signed)
Medication Instructions:  Your physician has recommended you make the following change in your medication:  1- Increase Losartan to 2 tablets (50 mg total ) once daily  If you need a refill on your cardiac medications before your next appointment, please call your pharmacy.   Lab work: Your physician recommends that you return for lab work in: 1 week following the start of increased medication dose.  If you have labs (blood work) drawn today and your tests are completely normal, you will receive your results only by: [name]. MyChart Message (if you have MyChart) OR . A paper copy in the mail If you have any lab test that is abnormal or we need to change your treatment, we will call you to review the results.  Testing/Procedures: 1- Your physician has requested that you have an exercise tolerance test. For further information please visit [email or web address]. Please also follow instruction sheet, as given.  ? Do not have any drinks or foods that have caffeine in them for 24 hours before the test, or as told by your doctor. This includes coffee, tea (even decaf tea), sodas, chocolate, and cocoa. ? Do not use any products that have nicotine or tobacco in them, such as cigarettes and e-cigarettes. Stop using them at least 4 hours before the test. If you need help quitting, ask your doctor.  Follow-Up: At [facility], you and your health needs are our priority.  As part of our continuing mission to provide you with exceptional heart care, we have created designated Provider Care Teams.  These Care Teams include your primary Cardiologist (physician) and Advanced Practice Providers (APPs -  Physician Assistants and Nurse Practitioners) who all work together to provide you with the care you need, when you need it. You will need a follow up appointment in 6 months.  Please call our office 2 months in advance to schedule this appointment.  You may see Julien Nordmannimothy Gollan, MD or one of the following Advanced  Practice Providers on your designated Care Team:   Nicolasa Duckinghristopher Berge, NP Eula Listenyan Dunn, PA-C . Marisue IvanJacquelyn Visser, PA-C    Exercise Stress Test An exercise stress test is a test to check how your heart works during exercise. You will need to walk on a treadmill or ride an exercise bike for this test. An electrocardiogram (ECG) will record your heartbeat when you are at rest and when you are exercising. You may have an ultrasound or nuclear test after the exercise test. The test is done to check for coronary artery disease (CAD). It is also done to:  See how well you can exercise.  Watch for high blood pressure during exercise.  Test how well you can exercise after treatment.  Check the blood flow to your arms and legs. If your test result is not normal, more testing may be needed. What happens before the procedure?  Follow instructions from your doctor about what you cannot eat or drink. ? Do not have any drinks or foods that have caffeine in them for 24 hours before the test, or as told by your doctor. This includes coffee, tea (even decaf tea), sodas, chocolate, and cocoa.  Ask your doctor about changing or stopping your normal medicines. This is important if you: ? Take diabetes medicines. ? Take beta-blocker medicines. ? Wear a nitroglycerin patch.  If you use an inhaler, bring it with you to the test.  Do not put lotions, powders, creams, or oils on your chest before the test.  Wear comfortable shoes  and clothing.  Do not use any products that have nicotine or tobacco in them, such as cigarettes and e-cigarettes. Stop using them at least 4 hours before the test. If you need help quitting, ask your doctor. What happens during the procedure?  Patches (electrodes) will be put on your chest.  Wires will be connected to the patches. The wires will send signals to a machine to record your heartbeat.  Your heart rate will be watched while you are resting and while you are exercising.  Your blood pressure will also be watched during the test.  You will walk on a treadmill or use a stationary bike. If you cannot use these, you may be asked to turn a crank with your hands.  The activity will get harder and will raise your heart rate.  You may be asked to breathe into a tube a few times during the test. This measures the gases that you breathe out.  You will be asked how you are feeling throughout the test.  You will exercise until your heart reaches a target heart rate. You will stop early if: ? You feel dizzy. ? You have chest pain. ? You are out of breath. ? Your blood pressure is too high or too low. ? You have an irregular heartbeat. ? You have pain or aching in your arms or legs. The procedure may vary among doctors and hospitals. What happens after the procedure?  Your blood pressure, heart rate, breathing rate, and blood oxygen level will be watched after the test.  You may return to your normal diet and activities as told by your doctor.  It is up to you to get the results of your test. Ask your doctor, or the department that is doing the test, when your results will be ready. Summary  An exercise stress test is a test to check how your heart works during exercise.  This test is done to check for coronary artery disease.  Your heart rate will be watched while you are resting and while you are exercising.  Follow instructions from your doctor about what you cannot eat or drink before the test. This information is not intended to replace advice given to you by your health care provider. Make sure you discuss any questions you have with your health care provider. Document Released: 07/27/2007 Document Revised: 05/10/2016 Document Reviewed: 05/10/2016 Elsevier Interactive Patient Education  2019 [employer]

## 2018-02-12 ENCOUNTER — Encounter (HOSPITAL_COMMUNITY): Payer: No Typology Code available for payment source

## 2018-02-13 ENCOUNTER — Encounter: Payer: Self-pay | Admitting: Vascular Surgery

## 2018-03-15 ENCOUNTER — Encounter: Payer: No Typology Code available for payment source | Admitting: Vascular Surgery

## 2018-07-25 ENCOUNTER — Other Ambulatory Visit: Payer: Self-pay | Admitting: Family Medicine

## 2018-09-14 ENCOUNTER — Other Ambulatory Visit: Payer: Self-pay | Admitting: Family Medicine

## 2018-09-14 NOTE — Telephone Encounter (Signed)
Ok to refill? Losartan Last fill 02/09/18  #90/3 Rosuvastain  Last fill 03/31/17  #30/2 Last OV 09/05/17 Next OV 10/25/18

## 2018-10-24 ENCOUNTER — Telehealth: Payer: Self-pay | Admitting: Family Medicine

## 2018-10-24 DIAGNOSIS — E7800 Pure hypercholesterolemia, unspecified: Secondary | ICD-10-CM

## 2018-10-24 NOTE — Telephone Encounter (Signed)
-----   Message from Cloyd Stagers, RT sent at 10/16/2018 10:06 AM EDT ----- Regarding: Lab Orders forThursday 9.3.2020 Please place lab orders for Thursday 9.3.2020, office visit for physical on Tuesday 9.8.2020 Thank you, Dyke Maes RT(R)

## 2018-10-25 ENCOUNTER — Other Ambulatory Visit: Payer: Self-pay

## 2018-10-25 ENCOUNTER — Other Ambulatory Visit (INDEPENDENT_AMBULATORY_CARE_PROVIDER_SITE_OTHER): Payer: No Typology Code available for payment source

## 2018-10-25 DIAGNOSIS — E7800 Pure hypercholesterolemia, unspecified: Secondary | ICD-10-CM

## 2018-10-25 LAB — LIPID PANEL
Cholesterol: 196 mg/dL (ref 0–200)
HDL: 39.3 mg/dL (ref >39.00)
NonHDL: 157.11
Total CHOL/HDL Ratio: 5
Triglycerides: 255 mg/dL — ABNORMAL HIGH (ref 0.0–149.0)
VLDL: 51 mg/dL — ABNORMAL HIGH (ref 0.0–40.0)

## 2018-10-25 LAB — COMPREHENSIVE METABOLIC PANEL WITH GFR
ALT: 50 U/L (ref 0–53)
AST: 23 U/L (ref 0–37)
Albumin: 4.6 g/dL (ref 3.5–5.2)
Alkaline Phosphatase: 36 U/L — ABNORMAL LOW (ref 39–117)
BUN: 15 mg/dL (ref 6–23)
CO2: 28 meq/L (ref 19–32)
Calcium: 9.7 mg/dL (ref 8.4–10.5)
Chloride: 102 meq/L (ref 96–112)
Creatinine, Ser: 1.23 mg/dL (ref 0.40–1.50)
GFR: 64.53 mL/min (ref >60.00)
Glucose, Bld: 91 mg/dL (ref 70–99)
Potassium: 4 meq/L (ref 3.5–5.1)
Sodium: 139 meq/L (ref 135–145)
Total Bilirubin: 1.3 mg/dL — ABNORMAL HIGH (ref 0.2–1.2)
Total Protein: 7.6 g/dL (ref 6.0–8.3)

## 2018-10-25 LAB — LDL CHOLESTEROL, DIRECT: Direct LDL: 132 mg/dL

## 2018-10-25 NOTE — Progress Notes (Signed)
No critical labs need to be addressed urgently. We will discuss labs in detail at upcoming office visit.   

## 2018-10-30 ENCOUNTER — Ambulatory Visit (INDEPENDENT_AMBULATORY_CARE_PROVIDER_SITE_OTHER): Payer: 59 | Admitting: Family Medicine

## 2018-10-30 ENCOUNTER — Encounter: Payer: Self-pay | Admitting: Family Medicine

## 2018-10-30 ENCOUNTER — Other Ambulatory Visit: Payer: Self-pay

## 2018-10-30 VITALS — BP 110/80 | HR 113 | Temp 98.6°F | Ht 73.5 in | Wt 250.8 lb

## 2018-10-30 DIAGNOSIS — I10 Essential (primary) hypertension: Secondary | ICD-10-CM | POA: Diagnosis not present

## 2018-10-30 DIAGNOSIS — Z0000 Encounter for general adult medical examination without abnormal findings: Secondary | ICD-10-CM | POA: Diagnosis not present

## 2018-10-30 DIAGNOSIS — E7800 Pure hypercholesterolemia, unspecified: Secondary | ICD-10-CM | POA: Diagnosis not present

## 2018-10-30 DIAGNOSIS — J4540 Moderate persistent asthma, uncomplicated: Secondary | ICD-10-CM

## 2018-10-30 DIAGNOSIS — Z3009 Encounter for other general counseling and advice on contraception: Secondary | ICD-10-CM

## 2018-10-30 MED ORDER — ALBUTEROL SULFATE HFA 108 (90 BASE) MCG/ACT IN AERS: 2.0000 | INHALATION_SPRAY | Freq: Four times a day (QID) | RESPIRATORY_TRACT | 2 refills | Status: DC | PRN

## 2018-10-30 NOTE — Patient Instructions (Addendum)
Consider increasing crestor to 2 times a week.  Work on increasing exercise as able and work on wiehgt  loss.  We wil call with the urology referral.

## 2018-10-30 NOTE — Assessment & Plan Note (Signed)
Improving .. increase crestor to 2 times weekly if tolerate. Low chol diet. Can call for re-eval in 3-6 months.

## 2018-10-30 NOTE — Assessment & Plan Note (Signed)
Well controlled. Continue current medication.  

## 2018-10-30 NOTE — Progress Notes (Signed)
Chief Complaint  Patient presents with  . Annual Exam    History of Present Illness: HPI  The patient is here for annual wellness exam and preventative care.    Hypertension:    Good control on losartan BP Readings from Last 3 Encounters:  10/30/18 110/80  02/09/18 (!) 130/100  09/05/17 132/88  Using medication without problems or lightheadedness: none Chest pain with exertion:none Edema:none Short of breath:none Average home BPs: Other issues:  Bilateral varicose veins.  Asthma, moderate persistent: ON advair, using albuterol prn and nasonex... he has been out for a while.. now refilled.  Elevated Cholesterol:  On crestor once weekly.. higher dose Lab Results  Component Value Date   CHOL 196 10/25/2018   HDL 39.30 10/25/2018   LDLCALC 207 (H) 08/15/2014   LDLDIRECT 132.0 10/25/2018   TRIG 255.0 (H) 10/25/2018   CHOLHDL 5 10/25/2018  Using medications without problems: Muscle aches:  Diet compliance: good Exercise: walking some Other complaints:    COVID 19 screen No recent travel or known exposure to [place] The patient denies respiratory symptoms of COVID 19 at this time.  The importance of social distancing was discussed today.   Review of Systems  Constitutional: Negative for chills and fever.  HENT: Negative for congestion and ear pain.   Eyes: Negative for pain and redness.  Respiratory: Negative for cough and shortness of breath.   Cardiovascular: Negative for chest pain, palpitations and leg swelling.  Gastrointestinal: Negative for abdominal pain, blood in stool, constipation, diarrhea, nausea and vomiting.  Genitourinary: Negative for dysuria.  Musculoskeletal: Negative for falls and myalgias.  Skin: Negative for rash.  Neurological: Negative for dizziness.  Psychiatric/Behavioral: Negative for depression. The patient is not nervous/anxious.       Past Medical History:  Diagnosis Date  . ADD (attention deficit disorder) without hyperactivity    . History of tobacco abuse quit 2006  . Pollen allergies   . Shingles   . Unspecified asthma(493.90)   . Varicose veins LEG PAIN OVER CALF VARICOSITIES    HX OF SCLEROTHERAPY     reports that he quit smoking about 17 years ago. His smoking use included cigarettes. He smoked 0.25 packs per day. He has never used smokeless tobacco. He reports that he does not drink alcohol or use drugs.   Current Outpatient Medications:  .  albuterol (PROAIR HFA) 108 (90 Base) MCG/ACT inhaler, Inhale 2 puffs into the lungs every 6 (six) hours as needed for wheezing., Disp: 8.5 g, Rfl: 2 .  Fluticasone-Salmeterol (ADVAIR DISKUS) 100-50 MCG/DOSE AEPB, Advair Diskus 100 mcg-50 mcg/dose powder for inhalation  INHALE 1 PUFF INTO THE LUNGS DAILY., Disp: , Rfl:  .  losartan (COZAAR) 25 MG tablet, TAKE 1 TABLET BY MOUTH EVERY DAY, Disp: 90 tablet, Rfl: 3 .  mometasone (NASONEX) 50 MCG/ACT nasal spray, Place 2 sprays into the nose daily., Disp: , Rfl:  .  rosuvastatin (CRESTOR) 5 MG tablet, TAKE 1 TABLET(5 MG) BY MOUTH DAILY (Patient taking differently: Take 5 mg by mouth once a week. ), Disp: 30 tablet, Rfl: 2   Observations/Objective: Blood pressure 110/80, pulse (!) 113, temperature 98.6 F (37 C), temperature source Temporal, height 6' 1.5" (1.867 m), weight 250 lb 12 oz (113.7 kg), SpO2 97 %.    Office Visit from 10/30/2018 in [place] at [facility]  PHQ-2 Total Score  0      Physical Exam Constitutional:      General: He is not in acute distress.  Appearance: Normal appearance. He is well-developed. He is not ill-appearing or toxic-appearing.  HENT:     Head: Normocephalic and atraumatic.     Right Ear: Hearing, tympanic membrane, ear canal and external ear normal.     Left Ear: Hearing, tympanic membrane, ear canal and external ear normal.     Nose: Nose normal.     Mouth/Throat:     Pharynx: Uvula midline.  Eyes:     General: Lids are normal. Lids are everted, no foreign bodies  appreciated.     Conjunctiva/sclera: Conjunctivae normal.     Pupils: Pupils are equal, round, and reactive to light.  Neck:     Musculoskeletal: Normal range of motion and neck supple.     Thyroid: No thyroid mass or thyromegaly.     Vascular: No carotid bruit.     Trachea: Trachea and phonation normal.  Cardiovascular:     Rate and Rhythm: Normal rate and regular rhythm.     Pulses: Normal pulses.     Heart sounds: S1 normal and S2 normal. No murmur. No gallop.      Comments: Bilateral varicose veins Pulmonary:     Breath sounds: Normal breath sounds. No wheezing, rhonchi or rales.  Abdominal:     General: Bowel sounds are normal.     Palpations: Abdomen is soft.     Tenderness: There is no abdominal tenderness. There is no guarding or rebound.     Hernia: No hernia is present.  Lymphadenopathy:     Cervical: No cervical adenopathy.  Skin:    General: Skin is warm and dry.     Findings: No rash.  Neurological:     Mental Status: He is alert.     Cranial Nerves: No cranial nerve deficit.     Sensory: No sensory deficit.     Gait: Gait normal.     Deep Tendon Reflexes: Reflexes are normal and symmetric.  Psychiatric:        Speech: Speech normal.        Behavior: Behavior normal.        Judgment: Judgment normal.      Assessment and Plan   The patient's preventative maintenance and recommended screening tests for an annual wellness exam were reviewed in full today. Brought up to date unless services declined.  Counselled on the importance of diet, exercise, and its role in overall health and mortality. The patient's FH and SH was reviewed, including their home life, tobacco status, and drug and alcohol status.   Vaccines:Tdap due, PNA uptodate, flu refused Prostate Cancer Screen:no early family hx Colon Cancer Screen:no early family hx      Smoking Status:none HIV screen:refused  ETOH: occ  no drug use.  Moderate persistent asthma Well controlled.  Continue current medication.   Essential hypertension, benign Well controlled. Continue current medication.   Hypercholesteremia Improving .. increase crestor to 2 times weekly if tolerate. Low chol diet. Can call for re-eval in 3-6 months.     Kerby NoraAmy Beva Remund, MD

## 2018-12-06 ENCOUNTER — Encounter: Payer: Self-pay | Admitting: Family Medicine

## 2019-01-04 ENCOUNTER — Other Ambulatory Visit: Payer: Self-pay

## 2019-01-04 ENCOUNTER — Ambulatory Visit: Payer: 59 | Admitting: Family Medicine

## 2019-01-04 ENCOUNTER — Ambulatory Visit (INDEPENDENT_AMBULATORY_CARE_PROVIDER_SITE_OTHER)
Admission: RE | Admit: 2019-01-04 | Discharge: 2019-01-04 | Disposition: A | Discharge status: Home / Self Care | Payer: 59 | Source: Ambulatory Visit | Attending: Family Medicine | Admitting: Family Medicine

## 2019-01-04 ENCOUNTER — Encounter: Payer: Self-pay | Admitting: Family Medicine

## 2019-01-04 VITALS — BP 106/82 | HR 105 | Temp 98.7°F | Ht 75.0 in | Wt 254.0 lb

## 2019-01-04 DIAGNOSIS — M79672 Pain in left foot: Secondary | ICD-10-CM | POA: Insufficient documentation

## 2019-01-04 MED ORDER — PREDNISONE 20 MG PO TABS: ORAL_TABLET | ORAL | 0 refills | Status: DC

## 2019-01-04 NOTE — Assessment & Plan Note (Signed)
X-ray foot negative for fracture.  Gout possible.. treat with prednisone and ice, elevation. Eval with uric acid.  Not clearly infection, no site of entry but if redness spreading pt to call for antibiotics.

## 2019-01-04 NOTE — Progress Notes (Signed)
Chief Complaint  Patient presents with  . Foot Pain    Left foot pain started 1 week ago. No known injury. Has had bilateral foot pain back and forth since the summer. But this is different from that.    History of Present Illness: HPI   43 year old male present with new onset pain in left foot    Over the summer swimming at pool started with  Right  Great toe dorsolateral pain.  Pain was intermittent. Resolved.  Started back stationary bike. Started walking in boot on trip.[name]. sprain in right ankle... improve.   Now in last week after hiking some .. pain  In left lateral. Foot.[name] area is warm, red. Pain with weight bearing.   Very tender to touch.   No known injury  No family or personal hisitory of gout.  Occ beer.    COVID 19 screen No recent travel or known exposure to COVID19 The patient denies respiratory symptoms of COVID 19 at this time.  The importance of social distancing was discussed today.   Review of Systems  Constitutional: Negative for chills and fever.  HENT: Negative for congestion and ear pain.   Eyes: Negative for pain and redness.  Respiratory: Negative for cough and shortness of breath.   Cardiovascular: Negative for chest pain, palpitations and leg swelling.  Gastrointestinal: Negative for abdominal pain, blood in stool, constipation, diarrhea, nausea and vomiting.  Genitourinary: Negative for dysuria.  Musculoskeletal: Negative for falls and myalgias.  Skin: Negative for rash.  Neurological: Negative for dizziness.  Psychiatric/Behavioral: Negative for depression. The patient is not nervous/anxious.       Past Medical History:  Diagnosis Date  . ADD (attention deficit disorder) without hyperactivity   . History of tobacco abuse quit 2006  . Pollen allergies   . Shingles   . Unspecified asthma(493.90)   . Varicose veins LEG PAIN OVER CALF VARICOSITIES    HX OF SCLEROTHERAPY     reports that he quit smoking about 17 years ago. His smoking  use included cigarettes. He smoked 0.25 packs per day. He has never used smokeless tobacco. He reports that he does not drink alcohol or use drugs.   Current Outpatient Medications:  .  albuterol (PROAIR HFA) 108 (90 Base) MCG/ACT inhaler, Inhale 2 puffs into the lungs every 6 (six) hours as needed for wheezing., Disp: 8.5 g, Rfl: 2 .  Fluticasone-Salmeterol (ADVAIR DISKUS) 100-50 MCG/DOSE AEPB, Advair Diskus 100 mcg-50 mcg/dose powder for inhalation  INHALE 1 PUFF INTO THE LUNGS DAILY., Disp: , Rfl:  .  losartan (COZAAR) 25 MG tablet, TAKE 1 TABLET BY MOUTH EVERY DAY, Disp: 90 tablet, Rfl: 3 .  mometasone (NASONEX) 50 MCG/ACT nasal spray, Place 2 sprays into the nose daily., Disp: , Rfl:  .  rosuvastatin (CRESTOR) 5 MG tablet, TAKE 1 TABLET(5 MG) BY MOUTH DAILY (Patient taking differently: Take 5 mg by mouth once a week. ), Disp: 30 tablet, Rfl: 2   Observations/Objective: Blood pressure 106/82, pulse (!) 105, temperature 98.7 F (37.1 C), height 6\' 3"  (1.905 m), weight 254 lb (115.2 kg), SpO2 96 %.  Physical Exam Constitutional:      Appearance: He is well-developed. He is obese.  HENT:     Head: Normocephalic.     Right Ear: Hearing normal.     Left Ear: Hearing normal.     Nose: Nose normal.  Neck:     Thyroid: No thyroid mass or thyromegaly.     Vascular: No carotid  bruit.     Trachea: Trachea normal.  Cardiovascular:     Rate and Rhythm: Normal rate and regular rhythm.     Pulses: Normal pulses.     Heart sounds: Heart sounds not distant. No murmur. No friction rub. No gallop.      Comments: No peripheral edema Pulmonary:     Effort: Pulmonary effort is normal. No respiratory distress.     Breath sounds: Normal breath sounds.  Musculoskeletal:     Comments: Left foot with redness warmth and pain to palpation in dorsal lateral area. Mild lateral swelling, , varicose veins, no calf swelling or pain.   Skin:    General: Skin is warm and dry.     Findings: No rash.   Psychiatric:        Speech: Speech normal.        Behavior: Behavior normal.        Thought Content: Thought content normal.      Assessment and Plan Left foot pain X-ray foot negative for fracture.  Gout possible.. treat with prednisone and ice, elevation. Eval with uric acid.  Not clearly infection, no site of entry but if redness spreading pt to call for antibiotics.       Kerby Nora, MD

## 2019-01-04 NOTE — Patient Instructions (Addendum)
Ice, elevation, nonweight bearing.  Start prednisone taper. We will call with lab results.  Call if redness spreading or pain increasing... consider ER.  Low-Purine Eating Plan A low-purine eating plan involves making food choices to limit your intake of purine. Purine is a kind of uric acid. Too much uric acid in your blood can cause certain conditions, such as gout and kidney stones. Eating a low-purine diet can help control these conditions. What are tips for following this plan? Reading food labels   Avoid foods with saturated or Trans fat.  Check the ingredient list of grains-based foods, such as bread and cereal, to make sure that they contain whole grains.  Check the ingredient list of sauces or soups to make sure they do not contain meat or fish.  When choosing soft drinks, check the ingredient list to make sure they do not contain high-fructose corn syrup. Shopping  Buy plenty of fresh fruits and vegetables.  Avoid buying canned or fresh fish.  Buy dairy products labeled as low-fat or nonfat.  Avoid buying premade or processed foods. These foods are often high in fat, salt (sodium), and added sugar. Cooking  Use olive oil instead of butter when cooking. Oils like olive oil, canola oil, and sunflower oil contain healthy fats. Meal planning  Learn which foods do or do not affect you. If you find out that a food tends to cause your gout symptoms to flare up, avoid eating that food. You can enjoy foods that do not cause problems. If you have any questions about a food item, talk with your dietitian or health care provider.  Limit foods high in fat, especially saturated fat. Fat makes it harder for your body to get rid of uric acid.  Choose foods that are lower in fat and are lean sources of protein. General guidelines  Limit alcohol intake to no more than 1 drink a day for nonpregnant women and 2 drinks a day for men. One drink equals 12 oz of beer, 5 oz of wine, or 1 oz  of hard liquor. Alcohol can affect the way your body gets rid of uric acid.  Drink plenty of water to keep your urine clear or pale yellow. Fluids can help remove uric acid from your body.  If directed by your health care provider, take a vitamin C supplement.  Work with your health care provider and dietitian to develop a plan to achieve or maintain a healthy weight. Losing weight can help reduce uric acid in your blood. What foods are recommended? The items listed may not be a complete list. Talk with your dietitian about what dietary choices are best for you. Foods low in purines Foods low in purines do not need to be limited. These include:  All fruits.  All low-purine vegetables, pickles, and olives.  Breads, pasta, rice, cornbread, and popcorn. Cake and other baked goods.  All dairy foods.  Eggs, nuts, and nut butters.  Spices and condiments, such as salt, herbs, and vinegar.  Plant oils, butter, and margarine.  Water, sugar-free soft drinks, tea, coffee, and cocoa.  Vegetable-based soups, broths, sauces, and gravies. Foods moderate in purines Foods moderate in purines should be limited to the amounts listed.   cup of asparagus, cauliflower, spinach, mushrooms, or green peas, each day.  2/3 cup uncooked oatmeal, each day.   cup dry wheat bran or wheat germ, each day.  2-3 ounces of meat or poultry, each day.  4-6 ounces of shellfish, such as crab,  lobster, oysters, or shrimp, each day.  1 cup cooked beans, peas, or lentils, each day.  Soup, broths, or bouillon made from meat or fish. Limit these foods as much as possible. What foods are not recommended? The items listed may not be a complete list. Talk with your dietitian about what dietary choices are best for you. Limit your intake of foods high in purines, including:  Beer and other alcohol.  Meat-based gravy or sauce.  Canned or fresh fish, such as: ? Anchovies, sardines, herring, and tuna. ? Mussels  and scallops. ? Codfish, trout, and haddock.  Berniece Salines.  Organ meats, such as: ? Liver or kidney. ? Tripe. ? Sweetbreads (thymus gland or pancreas).  Wild Clinical biochemist.  Yeast or yeast extract supplements.  Drinks sweetened with high-fructose corn syrup. Summary  Eating a low-purine diet can help control conditions caused by too much uric acid in the body, such as gout or kidney stones.  Choose low-purine foods, limit alcohol, and limit foods high in fat.  You will learn over time which foods do or do not affect you. If you find out that a food tends to cause your gout symptoms to flare up, avoid eating that food. This information is not intended to replace advice given to you by your health care provider. Make sure you discuss any questions you have with your health care provider. Document Released: 06/04/2010 Document Revised: 01/20/2017 Document Reviewed: 03/23/2016 Elsevier Patient Education  2020 [employer].

## 2019-01-05 LAB — CBC WITH DIFFERENTIAL/PLATELET
Absolute Monocytes: 655 cells/uL (ref 200–950)
Basophils Absolute: 58 cells/uL (ref 0–200)
Basophils Relative: 0.8 %
Eosinophils Absolute: 554 cells/uL — ABNORMAL HIGH (ref 15–500)
Eosinophils Relative: 7.7 %
HCT: 43.5 % (ref 38.5–50.0)
Hemoglobin: 15.7 g/dL (ref 13.2–17.1)
Lymphs Abs: 2218 cells/uL (ref 850–3900)
MCH: 31.8 pg (ref 27.0–33.0)
MCHC: 36.1 g/dL — ABNORMAL HIGH (ref 32.0–36.0)
MCV: 88.2 fL (ref 80.0–100.0)
MPV: 10.2 fL (ref 7.5–12.5)
Monocytes Relative: 9.1 %
Neutro Abs: 3715 cells/uL (ref 1500–7800)
Neutrophils Relative %: 51.6 %
Platelets: 299 10*3/uL (ref 140–400)
RBC: 4.93 10*6/uL (ref 4.20–5.80)
RDW: 12.1 % (ref 11.0–15.0)
Total Lymphocyte: 30.8 %
WBC: 7.2 10*3/uL (ref 3.8–10.8)

## 2019-01-05 LAB — URIC ACID: Uric Acid, Serum: 6.5 mg/dL (ref 4.0–8.0)

## 2019-01-08 ENCOUNTER — Encounter: Payer: Self-pay | Admitting: Podiatry

## 2019-01-08 ENCOUNTER — Ambulatory Visit (INDEPENDENT_AMBULATORY_CARE_PROVIDER_SITE_OTHER): Payer: 59

## 2019-01-08 ENCOUNTER — Ambulatory Visit: Payer: 59 | Admitting: Podiatry

## 2019-01-08 ENCOUNTER — Ambulatory Visit: Payer: 59

## 2019-01-08 ENCOUNTER — Other Ambulatory Visit: Payer: Self-pay

## 2019-01-08 ENCOUNTER — Other Ambulatory Visit: Payer: Self-pay | Admitting: Podiatry

## 2019-01-08 VITALS — BP 152/102 | HR 94

## 2019-01-08 DIAGNOSIS — M7752 Other enthesopathy of left foot: Secondary | ICD-10-CM | POA: Diagnosis not present

## 2019-01-08 DIAGNOSIS — S99922D Unspecified injury of left foot, subsequent encounter: Secondary | ICD-10-CM

## 2019-01-08 DIAGNOSIS — L539 Erythematous condition, unspecified: Secondary | ICD-10-CM | POA: Diagnosis not present

## 2019-01-08 DIAGNOSIS — M778 Other enthesopathies, not elsewhere classified: Secondary | ICD-10-CM

## 2019-01-08 DIAGNOSIS — R52 Pain, unspecified: Secondary | ICD-10-CM

## 2019-01-08 DIAGNOSIS — M779 Enthesopathy, unspecified: Secondary | ICD-10-CM

## 2019-01-08 MED ORDER — DOXYCYCLINE HYCLATE 100 MG PO TABS: 100.0000 mg | ORAL_TABLET | Freq: Two times a day (BID) | ORAL | 0 refills | Status: DC

## 2019-01-08 NOTE — Progress Notes (Signed)
Subjective:  Patient ID: Aaron Tyler, male    DOB: 19-Dec-1975,  MRN: [id number]  Chief Complaint  Patient presents with  . Foot Injury    left side of foot is painful after playing soccer  2 weeks ago    43 y.o. male presents with the above complaint.  Patient states that this left side pain started about 2 weeks ago.  He has been seeing another physician who thought that he might be an insect bite versus gout.  Patient was sent for uric acid level which was negative. He states that he has been playing a lot of soccer. It is very painful with sneakers and with ambulation. He has tried to take advil and prednisone which did not take the pain away. He denies any other acute complaints.   Review of Systems: Negative except as noted in the HPI. Denies N/V/F/Ch.  Past Medical History:  Diagnosis Date  . ADD (attention deficit disorder) without hyperactivity   . History of tobacco abuse quit 2006  . Pollen allergies   . Shingles   . Unspecified asthma(493.90)   . Varicose veins LEG PAIN OVER CALF VARICOSITIES    HX OF SCLEROTHERAPY     Current Outpatient Medications:  .  albuterol (PROAIR HFA) 108 (90 Base) MCG/ACT inhaler, Inhale 2 puffs into the lungs every 6 (six) hours as needed for wheezing., Disp: 8.5 g, Rfl: 2 .  Fluticasone-Salmeterol (ADVAIR DISKUS) 100-50 MCG/DOSE AEPB, Advair Diskus 100 mcg-50 mcg/dose powder for inhalation  INHALE 1 PUFF INTO THE LUNGS DAILY., Disp: , Rfl:  .  losartan (COZAAR) 25 MG tablet, TAKE 1 TABLET BY MOUTH EVERY DAY, Disp: 90 tablet, Rfl: 3 .  mometasone (NASONEX) 50 MCG/ACT nasal spray, Place 2 sprays into the nose daily., Disp: , Rfl:  .  predniSONE (DELTASONE) 20 MG tablet, 3 tabs by mouth daily x 3 days, then 2 tabs by mouth daily x 2 days then 1 tab by mouth daily x 2 days, Disp: 15 tablet, Rfl: 0 .  rosuvastatin (CRESTOR) 5 MG tablet, TAKE 1 TABLET(5 MG) BY MOUTH DAILY (Patient taking differently: Take 5 mg by mouth once a week. ), Disp: 30 tablet,  Rfl: 2 .  doxycycline (VIBRA-TABS) 100 MG tablet, Take 1 tablet (100 mg total) by mouth 2 (two) times daily., Disp: 20 tablet, Rfl: 0  Social History   Tobacco Use  Smoking Status Former Smoker  . Packs/day: 0.25  . Types: Cigarettes  . Quit date: 02/21/2001  . Years since quitting: 17.8  Smokeless Tobacco Never Used  Tobacco Comment   pt. smoked in 2003 for about 3 months on a social basis    No Known Allergies Objective:   Vitals:   01/08/19 1056  BP: (!) 152/102  Pulse: 94   There is no height or weight on file to calculate BMI. Constitutional Well developed. Well nourished.  Vascular Dorsalis pedis pulses palpable bilaterally. Posterior tibial pulses palpable bilaterally. Capillary refill normal to all digits.  No cyanosis or clubbing noted. Pedal hair growth normal.  Neurologic Normal speech. Oriented to person, place, and time. Epicritic sensation to light touch grossly present bilaterally.  Dermatologic Nails well groomed and normal in appearance. No open wounds. No skin lesions.  Orthopedic: Pain on palpation to the left dorsal lateral midfoot. Mild pain with dorsiflexion/plantarflexion of the digits active and passive. No area of fluctuance of crepitus noted. There is erythema present on the dorsal aspect of the lateral foot. No insect bite or any open  skin lesions noted.    Radiographs: No acute fracture or dislocation of the left foot. Joint spaces are well preserved.  No soft tissue swelling noted across the dorsum of the foot.  No area of fluctuance noted.  Assessment:   1. Pain   2. Tendinitis   3. Capsulitis of left foot   4. Erythema    Plan:  Patient was evaluated and treated and all questions answered.  Left dorsal lateral foot tendinitis/capsulitis -Given the focal point pain right across the dorsal lateral foot without underlying osseous involvement, I believe the patient will benefit from a steroid injection to help alleviate some of the pain.  -A steroid injection was performed at left dorsal lateral using 1% plain Lidocaine and 10 mg of Kenalog. This was well tolerated.  Left dorsal lateral erythema -There might be a component of infectious process involved likely due to from insect bite however, I will start the patient on antibiotic therapy.  A prescription for doxycycline was sent to the pharmacy.     No follow-ups on file.

## 2019-01-09 ENCOUNTER — Encounter: Payer: Self-pay | Admitting: Podiatry

## 2019-01-15 ENCOUNTER — Encounter: Payer: Self-pay | Admitting: Family Medicine

## 2019-01-29 ENCOUNTER — Encounter: Payer: Self-pay | Admitting: Podiatry

## 2019-01-29 ENCOUNTER — Other Ambulatory Visit: Payer: Self-pay

## 2019-01-29 ENCOUNTER — Ambulatory Visit: Payer: 59 | Admitting: Podiatry

## 2019-01-29 DIAGNOSIS — M779 Enthesopathy, unspecified: Secondary | ICD-10-CM

## 2019-01-29 DIAGNOSIS — M778 Other enthesopathies, not elsewhere classified: Secondary | ICD-10-CM

## 2019-01-29 DIAGNOSIS — L539 Erythematous condition, unspecified: Secondary | ICD-10-CM

## 2019-01-29 DIAGNOSIS — R52 Pain, unspecified: Secondary | ICD-10-CM

## 2019-01-29 NOTE — Progress Notes (Signed)
Subjective:  Patient ID: Aaron Tyler, male    DOB: 1975-09-13,  MRN: [id number]  Chief Complaint  Patient presents with  . Foot Pain    pt is here for a 3 week f/u of left foot pain, pt states that he is feeling alot better since the last time he was here, and also has no other comments or concerns    43 y.o. male presents with the above complaint.  Patient presents from follow-up from the left foot pain dorsal lateral aspect.  Patient states that he no longer has any pain he states that he feels a lot better since he got injection 3 weeks ago he also states that he completed his antibiotic course completely.  He denies any other acute complaints at this time.  He is completely pain-free.  Review of Systems: Negative except as noted in the HPI. Denies N/V/F/Ch.  Past Medical History:  Diagnosis Date  . ADD (attention deficit disorder) without hyperactivity   . History of tobacco abuse quit 2006  . Pollen allergies   . Shingles   . Unspecified asthma(493.90)   . Varicose veins LEG PAIN OVER CALF VARICOSITIES    HX OF SCLEROTHERAPY     Current Outpatient Medications:  .  albuterol (PROAIR HFA) 108 (90 Base) MCG/ACT inhaler, Inhale 2 puffs into the lungs every 6 (six) hours as needed for wheezing., Disp: 8.5 g, Rfl: 2 .  doxycycline (VIBRA-TABS) 100 MG tablet, Take 1 tablet (100 mg total) by mouth 2 (two) times daily., Disp: 20 tablet, Rfl: 0 .  Fluticasone-Salmeterol (ADVAIR DISKUS) 100-50 MCG/DOSE AEPB, Advair Diskus 100 mcg-50 mcg/dose powder for inhalation  INHALE 1 PUFF INTO THE LUNGS DAILY., Disp: , Rfl:  .  losartan (COZAAR) 25 MG tablet, TAKE 1 TABLET BY MOUTH EVERY DAY, Disp: 90 tablet, Rfl: 3 .  mometasone (NASONEX) 50 MCG/ACT nasal spray, Place 2 sprays into the nose daily., Disp: , Rfl:  .  predniSONE (DELTASONE) 20 MG tablet, 3 tabs by mouth daily x 3 days, then 2 tabs by mouth daily x 2 days then 1 tab by mouth daily x 2 days, Disp: 15 tablet, Rfl: 0 .  rosuvastatin  (CRESTOR) 5 MG tablet, TAKE 1 TABLET(5 MG) BY MOUTH DAILY (Patient taking differently: Take 5 mg by mouth once a week. ), Disp: 30 tablet, Rfl: 2  Social History   Tobacco Use  Smoking Status Former Smoker  . Packs/day: 0.25  . Types: Cigarettes  . Quit date: 02/21/2001  . Years since quitting: 17.9  Smokeless Tobacco Never Used  Tobacco Comment   pt. smoked in 2003 for about 3 months on a social basis    No Known Allergies Objective:   There were no vitals filed for this visit. There is no height or weight on file to calculate BMI. Constitutional Well developed. Well nourished.  Vascular Dorsalis pedis pulses palpable bilaterally. Posterior tibial pulses palpable bilaterally. Capillary refill normal to all digits.  No cyanosis or clubbing noted. Pedal hair growth normal.  Neurologic Normal speech. Oriented to person, place, and time. Epicritic sensation to light touch grossly present bilaterally.  Dermatologic Nails well groomed and normal in appearance. No open wounds. No skin lesions.  Orthopedic:  No pain  on palpation to the left dorsal lateral midfoot.  No pain with dorsiflexion/plantarflexion of the digits active and passive. No area of fluctuance of crepitus noted.  Erythema resolved. No insect bite or any open skin lesions noted.    Radiographs: None Assessment:  1. Pain   2. Tendinitis   3. Capsulitis of left foot   4. Erythema    Plan:  Patient was evaluated and treated and all questions answered.  Left dorsal lateral foot tendinitis/capsulitis -Resolved.  Given the amount of improvement the patient has done over the last 3 weeks, I believe the patient does not need any more injection.  He still has slight dull achiness.  I explained to the patient that if it ever flares back up in the future come back and see me and will take care of this right away.  Left dorsal lateral erythema -Resolved     No follow-ups on file.

## 2019-02-15 ENCOUNTER — Other Ambulatory Visit: Payer: Self-pay | Admitting: Family Medicine

## 2019-06-28 ENCOUNTER — Ambulatory Visit: Payer: 59 | Attending: Internal Medicine

## 2019-06-28 DIAGNOSIS — Z23 Encounter for immunization: Secondary | ICD-10-CM

## 2019-06-28 NOTE — Progress Notes (Signed)
   Covid-19 Vaccination Clinic  Name:  Aaron Tyler    MRN: [id number] DOB: [birth year]  06/28/2019  [name] was observed post Covid-19 immunization for 15 minutes without incident. He was provided with Vaccine Information Sheet and instruction to access the V-Safe system.   [name] was instructed to call 911 with any severe reactions post vaccine: [name] Difficulty breathing  . Swelling of face and throat  . A fast heartbeat  . A bad rash all over body  . Dizziness and weakness   Immunizations Administered    Name Date Dose VIS Date Route   Pfizer COVID-19 Vaccine 06/28/2019 10:10 AM 0.3 mL 04/17/2018 Intramuscular   Manufacturer: [employer], [employer]   Lot: [id number]   NDC: [address]-6

## 2019-07-15 ENCOUNTER — Ambulatory Visit: Payer: 59 | Attending: Internal Medicine

## 2019-07-15 DIAGNOSIS — Z20822 Contact with and (suspected) exposure to covid-19: Secondary | ICD-10-CM

## 2019-07-16 LAB — NOVEL CORONAVIRUS, NAA: SARS-CoV-2, NAA: NOT DETECTED

## 2019-07-16 LAB — SARS-COV-2, NAA 2 DAY TAT

## 2019-07-30 ENCOUNTER — Ambulatory Visit: Payer: 59 | Attending: Internal Medicine

## 2019-07-30 DIAGNOSIS — Z23 Encounter for immunization: Secondary | ICD-10-CM

## 2019-07-30 NOTE — Progress Notes (Signed)
   Covid-19 Vaccination Clinic  Name:  Aaron Tyler    MRN: [id number] DOB: August 11, 1975  07/30/2019  [name] was observed post Covid-19 immunization for 15 minutes without incident. He was provided with Vaccine Information Sheet and instruction to access the V-Safe system.   [name] was instructed to call 911 with any severe reactions post vaccine: [name] Difficulty breathing  . Swelling of face and throat  . A fast heartbeat  . A bad rash all over body  . Dizziness and weakness   Immunizations Administered    Name Date Dose VIS Date Route   Pfizer COVID-19 Vaccine 07/30/2019  8:19 AM 0.3 mL 04/17/2018 Intramuscular   Manufacturer: [employer], [employer]   Lot: [id number]   NDC: [address]-1

## 2019-09-11 ENCOUNTER — Other Ambulatory Visit: Payer: Self-pay | Admitting: Family Medicine

## 2019-11-10 ENCOUNTER — Telehealth: Payer: Self-pay | Admitting: Family Medicine

## 2019-11-11 NOTE — Telephone Encounter (Signed)
Please schedule CPE with fasting labs prior with Dr. Bedsole.  

## 2019-11-13 NOTE — Telephone Encounter (Signed)
Left message asking pt to call office  °

## 2019-11-14 NOTE — Telephone Encounter (Signed)
Labs 11/8 cpx 11/11 Pt aware

## 2019-12-30 ENCOUNTER — Telehealth: Payer: Self-pay | Admitting: Family Medicine

## 2019-12-30 ENCOUNTER — Other Ambulatory Visit: Payer: 59

## 2019-12-30 DIAGNOSIS — E7800 Pure hypercholesterolemia, unspecified: Secondary | ICD-10-CM

## 2019-12-30 NOTE — Telephone Encounter (Signed)
-----   Message from Aquilla Solian, RT sent at 12/17/2019  1:19 PM EDT ----- Regarding: Lab Orders for Monday 11.8.2021 Please place lab orders for Monday 11.8.2021, office visit for physical on Thursday 11.11.2021 Thank you, Jones Bales RT(R)

## 2020-01-02 ENCOUNTER — Encounter: Payer: 59 | Admitting: Family Medicine

## 2020-01-24 ENCOUNTER — Other Ambulatory Visit: Payer: Self-pay

## 2020-01-24 ENCOUNTER — Other Ambulatory Visit (INDEPENDENT_AMBULATORY_CARE_PROVIDER_SITE_OTHER): Payer: 59

## 2020-01-24 DIAGNOSIS — E7800 Pure hypercholesterolemia, unspecified: Secondary | ICD-10-CM | POA: Diagnosis not present

## 2020-01-24 LAB — COMPREHENSIVE METABOLIC PANEL
ALT: 51 U/L (ref 0–53)
AST: 24 U/L (ref 0–37)
Albumin: 4.5 g/dL (ref 3.5–5.2)
Alkaline Phosphatase: 45 U/L (ref 39–117)
BUN: 15 mg/dL (ref 6–23)
CO2: 29 mEq/L (ref 19–32)
Calcium: 9.3 mg/dL (ref 8.4–10.5)
Chloride: 102 mEq/L (ref 96–112)
Creatinine, Ser: 1.03 mg/dL (ref 0.40–1.50)
GFR: 89.13 mL/min (ref >60.00)
Glucose, Bld: 88 mg/dL (ref 70–99)
Potassium: 4.3 mEq/L (ref 3.5–5.1)
Sodium: 140 mEq/L (ref 135–145)
Total Bilirubin: 1 mg/dL (ref 0.2–1.2)
Total Protein: 7.6 g/dL (ref 6.0–8.3)

## 2020-01-24 LAB — LIPID PANEL
Cholesterol: 211 mg/dL — ABNORMAL HIGH (ref 0–200)
HDL: 42 mg/dL (ref >39.00)
LDL Cholesterol: 138 mg/dL — ABNORMAL HIGH (ref 0–99)
NonHDL: 169.05
Total CHOL/HDL Ratio: 5
Triglycerides: 153 mg/dL — ABNORMAL HIGH (ref 0.0–149.0)
VLDL: 30.6 mg/dL (ref 0.0–40.0)

## 2020-01-24 NOTE — Progress Notes (Signed)
No critical labs need to be addressed urgently. We will discuss labs in detail at upcoming office visit.   

## 2020-01-28 ENCOUNTER — Encounter: Payer: Self-pay | Admitting: Family Medicine

## 2020-01-28 ENCOUNTER — Ambulatory Visit (INDEPENDENT_AMBULATORY_CARE_PROVIDER_SITE_OTHER): Payer: 59 | Admitting: Family Medicine

## 2020-01-28 ENCOUNTER — Other Ambulatory Visit: Payer: Self-pay

## 2020-01-28 DIAGNOSIS — E7800 Pure hypercholesterolemia, unspecified: Secondary | ICD-10-CM

## 2020-02-20 NOTE — Progress Notes (Signed)
Cancelled.  

## 2020-02-26 ENCOUNTER — Other Ambulatory Visit: Payer: Self-pay | Admitting: Family Medicine

## 2020-02-27 ENCOUNTER — Telehealth: Payer: Self-pay | Admitting: Family Medicine

## 2020-02-27 NOTE — Telephone Encounter (Signed)
Patient needs a refill for the following prescription. Medication: Advair  Pharmacy: [facility] in [place]

## 2020-02-27 NOTE — Telephone Encounter (Addendum)
Advair refill was sent to his pharmacy 02/26/2020.  Aaron Tyler notified of this via telephone.

## 2020-02-28 ENCOUNTER — Other Ambulatory Visit: Payer: Self-pay | Admitting: Family Medicine

## 2020-03-17 ENCOUNTER — Encounter: Payer: 59 | Admitting: Family Medicine

## 2020-03-26 ENCOUNTER — Other Ambulatory Visit: Payer: Self-pay | Admitting: Family Medicine

## 2020-04-03 ENCOUNTER — Other Ambulatory Visit: Payer: Self-pay

## 2020-04-03 ENCOUNTER — Encounter: Payer: Self-pay | Admitting: Family Medicine

## 2020-04-03 ENCOUNTER — Ambulatory Visit (INDEPENDENT_AMBULATORY_CARE_PROVIDER_SITE_OTHER): Payer: 59 | Admitting: Family Medicine

## 2020-04-03 VITALS — BP 120/84 | HR 93 | Temp 98.2°F | Ht 73.75 in | Wt 265.5 lb

## 2020-04-03 DIAGNOSIS — Z6834 Body mass index (BMI) 34.0-34.9, adult: Secondary | ICD-10-CM

## 2020-04-03 DIAGNOSIS — J4540 Moderate persistent asthma, uncomplicated: Secondary | ICD-10-CM

## 2020-04-03 DIAGNOSIS — I10 Essential (primary) hypertension: Secondary | ICD-10-CM | POA: Diagnosis not present

## 2020-04-03 DIAGNOSIS — E6609 Other obesity due to excess calories: Secondary | ICD-10-CM

## 2020-04-03 DIAGNOSIS — Z0000 Encounter for general adult medical examination without abnormal findings: Secondary | ICD-10-CM

## 2020-04-03 DIAGNOSIS — E7800 Pure hypercholesterolemia, unspecified: Secondary | ICD-10-CM | POA: Diagnosis not present

## 2020-04-03 MED ORDER — FLUTICASONE-SALMETEROL 100-50 MCG/DOSE IN AEPB: 1.0000 | INHALATION_SPRAY | RESPIRATORY_TRACT | 11 refills | Status: DC

## 2020-04-03 MED ORDER — ALBUTEROL SULFATE HFA 108 (90 BASE) MCG/ACT IN AERS: 2.0000 | INHALATION_SPRAY | Freq: Four times a day (QID) | RESPIRATORY_TRACT | 2 refills | Status: DC | PRN

## 2020-04-03 NOTE — Progress Notes (Signed)
Patient ID: ELPIDIO THIELEN, male    DOB: Oct 21, 1975, 45 y.o.   MRN: [id number]  This visit was conducted in person.  Pulse 93   Temp 98.2 F (36.8 C) (Temporal)   Ht 6' 1.75" (1.873 m)   Wt 265 lb 8 oz (120.4 kg)   SpO2 98%   BMI 34.32 kg/m    CC:  Chief Complaint  Patient presents with  . Annual Exam    Subjective:   HPI: MATHIS CASHMAN is a 45 y.o. male presenting on 04/03/2020 for Annual Exam    Hypertension:   Great control 120/84 on losartan low dose.    Using medication without problems or lightheadedness:  none Chest pain with exertion:none Edema:none Short of breath: decreased enduracne Average home BPs: not checking Other issues:  Elevated Cholesterol:  Tolerable control on crestor  Lab Results  Component Value Date   CHOL 211 (H) 01/24/2020   HDL 42.00 01/24/2020   LDLCALC 138 (H) 01/24/2020   LDLDIRECT 132.0 10/25/2018   TRIG 153.0 (H) 01/24/2020   CHOLHDL 5 01/24/2020  Using medications without problems: Muscle aches:  Diet compliance: moderate Exercise: minimal.. planning to get back to it. Other complaints:  The 10-year ASCVD risk score Denman George DC Montez Hageman., et al., 2013) is: 2.3%   Values used to calculate the score:     Age: 40 years     Sex: Male     Is Non-Hispanic African American: No     Diabetic: No     Tobacco smoker: No     Systolic Blood Pressure: 120 mmHg     Is BP treated: Yes     HDL Cholesterol: 42 mg/dL     Total Cholesterol: 211 mg/dL   ADD, GAD:   Wt Readings from Last 3 Encounters:  04/03/20 265 lb 8 oz (120.4 kg)  01/04/19 254 lb (115.2 kg)  10/30/18 250 lb 12 oz (113.7 kg)       Asthma, moderate persistent: ON advair, using albuterol prn and nasonex... he has been out for a while.. now refilled.  Relevant past medical, surgical, family and social history reviewed and updated as indicated. Interim medical history since our last visit reviewed. Allergies and medications reviewed and updated. Outpatient Medications Prior  to Visit  Medication Sig Dispense Refill  . Fluticasone-Salmeterol (ADVAIR) 100-50 MCG/DOSE AEPB Inhale 1 puff into the lungs every 3 (three) days.    [name] losartan (COZAAR) 25 MG tablet TAKE 1 TABLET BY MOUTH EVERY DAY 90 tablet 0  . mometasone (NASONEX) 50 MCG/ACT nasal spray Place 2 sprays into the nose daily.    . rosuvastatin (CRESTOR) 5 MG tablet Take 5 mg by mouth once a week.    [name] albuterol (PROAIR HFA) 108 (90 Base) MCG/ACT inhaler Inhale 2 puffs into the lungs every 6 (six) hours as needed for wheezing. 8.5 g 2  . doxycycline (VIBRA-TABS) 100 MG tablet Take 1 tablet (100 mg total) by mouth 2 (two) times daily. 20 tablet 0  . predniSONE (DELTASONE) 20 MG tablet 3 tabs by mouth daily x 3 days, then 2 tabs by mouth daily x 2 days then 1 tab by mouth daily x 2 days 15 tablet 0  . rosuvastatin (CRESTOR) 5 MG tablet Take one tablet two times a week 30 tablet 0  . WIXELA INHUB 100-50 MCG/DOSE AEPB INHALE 1 PUFF BY MOUTH EVERY DAY 60 each 5   No facility-administered medications prior to visit.     Per HPI unless  specifically indicated in ROS section below Review of Systems  Constitutional: Negative for chills and fever.  HENT: Negative for congestion and ear pain.   Eyes: Negative for pain and redness.  Respiratory: Negative for cough and shortness of breath.   Cardiovascular: Negative for chest pain, palpitations and leg swelling.  Gastrointestinal: Negative for abdominal pain, blood in stool, constipation, diarrhea, nausea and vomiting.  Genitourinary: Negative for dysuria.  Musculoskeletal: Negative for myalgias.  Skin: Negative for rash.  Neurological: Negative for dizziness.  Psychiatric/Behavioral: The patient is not nervous/anxious.    Objective:  Pulse 93   Temp 98.2 F (36.8 C) (Temporal)   Ht 6' 1.75" (1.873 m)   Wt 265 lb 8 oz (120.4 kg)   SpO2 98%   BMI 34.32 kg/m   Wt Readings from Last 3 Encounters:  04/03/20 265 lb 8 oz (120.4 kg)  01/04/19 254 lb (115.2 kg)   10/30/18 250 lb 12 oz (113.7 kg)      Physical Exam Constitutional:      General: Vital signs are normal.     Appearance: He is well-developed and well-nourished. He is obese.  HENT:     Head: Normocephalic.     Right Ear: Hearing normal.     Left Ear: Hearing normal.     Nose: Nose normal.     Mouth/Throat:     Mouth: Oropharynx is clear and moist and mucous membranes are normal.  Neck:     Thyroid: No thyroid mass or thyromegaly.     Vascular: No carotid bruit.     Trachea: Trachea normal.  Cardiovascular:     Rate and Rhythm: Normal rate and regular rhythm.     Pulses: Normal pulses.     Heart sounds: Heart sounds not distant. No murmur heard. No friction rub. No gallop.      Comments: No peripheral edema Pulmonary:     Effort: Pulmonary effort is normal. No respiratory distress.     Breath sounds: Normal breath sounds.  Skin:    General: Skin is warm, dry and intact.     Findings: No rash.  Psychiatric:        Mood and Affect: Mood and affect normal.        Speech: Speech normal.        Behavior: Behavior normal.        Thought Content: Thought content normal.       Results for orders placed or performed in visit on 01/24/20  Comprehensive metabolic panel  Result Value Ref Range   Sodium 140 135 - 145 mEq/L   Potassium 4.3 3.5 - 5.1 mEq/L   Chloride 102 96 - 112 mEq/L   CO2 29 19 - 32 mEq/L   Glucose, Bld 88 70 - 99 mg/dL   BUN 15 6 - 23 mg/dL   Creatinine, Ser 3.35 0.40 - 1.50 mg/dL   Total Bilirubin 1.0 0.2 - 1.2 mg/dL   Alkaline Phosphatase 45 39 - 117 U/L   AST 24 0 - 37 U/L   ALT 51 0 - 53 U/L   Total Protein 7.6 6.0 - 8.3 g/dL   Albumin 4.5 3.5 - 5.2 g/dL   GFR 45.62 >56.38 mL/min   Calcium 9.3 8.4 - 10.5 mg/dL  Lipid panel  Result Value Ref Range   Cholesterol 211 (H) 0 - 200 mg/dL   Triglycerides 937.3 (H) 0.0 - 149.0 mg/dL   HDL 42.87 >68.11 mg/dL   VLDL 57.2 0.0 - 62.0 mg/dL  LDL Cholesterol 138 (H) 0 - 99 mg/dL   Total CHOL/HDL Ratio 5     NonHDL 169.05     This visit occurred during the SARS-CoV-2 public health emergency.  Safety protocols were in place, including screening questions prior to the visit, additional usage of staff PPE, and extensive cleaning of exam room while observing appropriate contact time as indicated for disinfecting solutions.   COVID 19 screen:  No recent travel or known exposure to COVID19 The patient denies respiratory symptoms of COVID 19 at this time. The importance of social distancing was discussed today.   Assessment and Plan The patient's preventative maintenance and recommended screening tests for an annual wellness exam were reviewed in full today. Brought up to date unless services declined.  Counselled on the importance of diet, exercise, and its role in overall health and mortality. The patient's FH and SH was reviewed, including their home life, tobacco status, and drug and alcohol status.   Vaccines: COVID x 2, Tdap due, PNA uptodate, flu refused Prostate Cancer Screen:no early family hx Colon Cancer Screen:no early family hx      Smoking Status:none HIV screen:refused ETOH: occ no drug use.  Problem List Items Addressed This Visit    Class 1 obesity due to excess calories with serious comorbidity and body mass index (BMI) of 34.0 to 34.9 in adult    Encouraged exercise, weight loss, healthy eating habits.       Essential hypertension, benign    Stable, chronic.  Continue current medication.         Relevant Medications   rosuvastatin (CRESTOR) 5 MG tablet   Hypercholesteremia    Stable, chronic.  Continue current medication.         Relevant Medications   rosuvastatin (CRESTOR) 5 MG tablet   Moderate persistent asthma    Stable, chronic.  Continue current medication.         Relevant Medications   albuterol (PROAIR HFA) 108 (90 Base) MCG/ACT inhaler   Fluticasone-Salmeterol (ADVAIR) 100-50 MCG/DOSE AEPB    Other Visit Diagnoses     Routine general medical examination at a health care facility    -  Primary        Kerby Nora, MD

## 2020-04-03 NOTE — Assessment & Plan Note (Signed)
Encouraged exercise, weight loss, healthy eating habits. ? ?

## 2020-04-03 NOTE — Assessment & Plan Note (Signed)
Stable, chronic.  Continue current medication.    

## 2020-04-03 NOTE — Patient Instructions (Addendum)
Set up COVID booster.  Get back on track with lifestyle changes... work on decreasing BMI.   Preventive Care 65-44 Years Old, Male Preventive care refers to lifestyle choices and visits with your health care provider that can promote health and wellness. This includes:  A yearly physical exam. This is also called an annual wellness visit.  Regular dental and eye exams.  Immunizations.  Screening for certain conditions.  Healthy lifestyle choices, such as: ? Eating a healthy diet. ? Getting regular exercise. ? Not using drugs or products that contain nicotine and tobacco. ? Limiting alcohol use. What can I expect for my preventive care visit? Physical exam Your health care provider will check your:  Height and weight. These may be used to calculate your BMI (body mass index). BMI is a measurement that tells if you are at a healthy weight.  Heart rate and blood pressure.  Body temperature.  Skin for abnormal spots. Counseling Your health care provider may ask you questions about your:  Past medical problems.  Family's medical history.  Alcohol, tobacco, and drug use.  Emotional well-being.  Home life and relationship well-being.  Sexual activity.  Diet, exercise, and sleep habits.  Work and work Astronomer.  Access to firearms. What immunizations do I need? Vaccines are usually given at various ages, according to a schedule. Your health care provider will recommend vaccines for you based on your age, medical history, and lifestyle or other factors, such as travel or where you work.   What tests do I need? Blood tests  Lipid and cholesterol levels. These may be checked every 5 years, or more often if you are over 58 years old.  Hepatitis C test.  Hepatitis B test. Screening  Lung cancer screening. You may have this screening every year starting at age 4 if you have a 30-pack-year history of smoking and currently smoke or have quit within the past 15  years.  Prostate cancer screening. Recommendations will vary depending on your family history and other risks.  Genital exam to check for testicular cancer or hernias.  Colorectal cancer screening. ? All adults should have this screening starting at age 32 and continuing until age 40. ? Your health care provider may recommend screening at age 89 if you are at increased risk. ? You will have tests every 1-10 years, depending on your results and the type of screening test.  Diabetes screening. ? This is done by checking your blood sugar (glucose) after you have not eaten for a while (fasting). ? You may have this done every 1-3 years.  STD (sexually transmitted disease) testing, if you are at risk. Follow these instructions at home: Eating and drinking  Eat a diet that includes fresh fruits and vegetables, whole grains, lean protein, and low-fat dairy products.  Take vitamin and mineral supplements as recommended by your health care provider.  Do not drink alcohol if your health care provider tells you not to drink.  If you drink alcohol: ? Limit how much you have to 0-2 drinks a day. ? Be aware of how much alcohol is in your drink. In the [place] one drink equals one 12 oz bottle of beer (355 mL), one 5 oz glass of wine (148 mL), or one 1 oz glass of hard liquor (44 mL).   Lifestyle  Take daily care of your teeth and gums. Brush your teeth every morning and night with fluoride toothpaste. Floss one time each day.  Stay active. Exercise for at least  30 minutes 5 or more days each week.  Do not use any products that contain nicotine or tobacco, such as cigarettes, e-cigarettes, and chewing tobacco. If you need help quitting, ask your health care provider.  Do not use drugs.  If you are sexually active, practice safe sex. Use a condom or other form of protection to prevent STIs (sexually transmitted infections).  If told by your health care provider, take low-dose aspirin daily  starting at age 40.  Find healthy ways to cope with stress, such as: ? Meditation, yoga, or listening to music. ? Journaling. ? Talking to a trusted person. ? Spending time with friends and family. Safety  Always wear your seat belt while driving or riding in a vehicle.  Do not drive: ? If you have been drinking alcohol. Do not ride with someone who has been drinking. ? When you are tired or distracted. ? While texting.  Wear a helmet and other protective equipment during sports activities.  If you have firearms in your house, make sure you follow all gun safety procedures. What's next?  Go to your health care provider once a year for an annual wellness visit.  Ask your health care provider how often you should have your eyes and teeth checked.  Stay up to date on all vaccines. This information is not intended to replace advice given to you by your health care provider. Make sure you discuss any questions you have with your health care provider. Document Revised: 11/06/2018 Document Reviewed: 02/01/2018 Elsevier Patient Education  2021 [employer].

## 2020-04-11 ENCOUNTER — Other Ambulatory Visit: Payer: Self-pay | Admitting: Family Medicine

## 2020-06-22 NOTE — Progress Notes (Deleted)
Cardiology Office Note  Date:  06/22/2020   ID:  KONSTANTIN LEHNEN, DOB 19-Aug-1975, MRN [id number]  PCP:  Excell Seltzer, MD   No chief complaint on file.   HPI:  45 yo gentleman with history of  hypertension, obesity, ADD, previous smoker, asthma , varicose veins Atypical chest pain  who presents for follow-up of his chest pain symptoms  Last seen by myself in clinic November 2017 Seen by one of our providers December 2019 Had atypical chest pain at that time     He reports that he is active, healthy, 3 children ages 21, 84, 75 Feels he is overweight, has not been exercising or watching his diet as he could. Concerned about his high cholesterol given strong family history Lost 20 pounds recently, by watching his diet Not particularly eager to take a cholesterol medication  Feels he may be overmedicated in terms of his blood pressure Often has dizziness if he bends down and stands up too quickly Reported having this while working in the garden recently [employer] if he could decrease the dose of his blood pressure pill  CT scan 2012 abdomen reviewed with him in detail showing no coronary calcifications in the distal coronary arteries, no plaque noted in the descending aorta  EKG on today's visit shows normal sinus rhythm with rate 75 bpm, no significant ST or T-wave changes  Dad died at age 70 from MI, smoker, hyperlipidemia Mom with valve surgery   PMH:   has a past medical history of ADD (attention deficit disorder) without hyperactivity, History of tobacco abuse (quit 2006), Pollen allergies, Shingles, Unspecified asthma(493.90), and Varicose veins (LEG PAIN OVER CALF VARICOSITIES    HX OF SCLEROTHERAPY ).  PSH:    Past Surgical History:  Procedure Laterality Date  . ENDOVENOUS ABLATION SAPHENOUS VEIN W/ LASER  06-16-2011   right greater saphenous vein and stab phlebectomy >20 right leg  . ENDOVENOUS ABLATION SAPHENOUS VEIN W/ LASER  09-15-2011   left greater saphenous vein  and stab phlebectomies>20 Incisions left leg  . ESOPHAGOGASTRODUODENOSCOPY     ?[place]  . surgical removal of splinter (foreign body) right foot  06-2011   right foot  . VARICOSE VEIN SURGERY     sclerotherapy    Current Outpatient Medications  Medication Sig Dispense Refill  . albuterol (PROAIR HFA) 108 (90 Base) MCG/ACT inhaler Inhale 2 puffs into the lungs every 6 (six) hours as needed for wheezing. 8.5 g 2  . Fluticasone-Salmeterol (ADVAIR) 100-50 MCG/DOSE AEPB Inhale 1 puff into the lungs every 3 (three) days. 60 each 11  . losartan (COZAAR) 25 MG tablet TAKE 1 TABLET BY MOUTH EVERY DAY 90 tablet 3  . mometasone (NASONEX) 50 MCG/ACT nasal spray Place 2 sprays into the nose daily.    . rosuvastatin (CRESTOR) 5 MG tablet Take 1 tablet (5 mg total) by mouth once a week. 12 tablet 3   No current facility-administered medications for this visit.     Allergies:   Patient has no known allergies.   Social History:  The patient  reports that he quit smoking about 19 years ago. His smoking use included cigarettes. He smoked 0.25 packs per day. He has never used smokeless tobacco. He reports that he does not drink alcohol and does not use drugs.   Family History:   family history includes Cancer in his brother and maternal grandfather; Heart attack (age of onset: 72) in his father; Heart disease in his mother; Hyperlipidemia in his brother, brother,  and brother; Hypertension in his father.    Review of Systems: Review of Systems  Constitutional: Negative.   Respiratory: Negative.   Cardiovascular: Negative.   Gastrointestinal: Negative.   Musculoskeletal: Negative.   Neurological: Negative.   Psychiatric/Behavioral: Negative.   All other systems reviewed and are negative.    PHYSICAL EXAM: VS:  There were no vitals taken for this visit. , BMI There is no height or weight on file to calculate BMI. GEN: Well nourished, well developed, in no acute distress  HEENT: normal  Neck:  no JVD, carotid bruits, or masses Cardiac: RRR; no murmurs, rubs, or gallops,no edema  Respiratory:  clear to auscultation bilaterally, normal work of breathing GI: soft, nontender, nondistended, + BS MS: no deformity or atrophy  Skin: warm and dry, no rash Neuro:  Strength and sensation are intact Psych: euthymic mood, full affect    Recent Labs: 01/24/2020: ALT 51; BUN 15; Creatinine, Ser 1.03; Potassium 4.3; Sodium 140    Lipid Panel Lab Results  Component Value Date   CHOL 211 (H) 01/24/2020   HDL 42.00 01/24/2020   LDLCALC 138 (H) 01/24/2020   TRIG 153.0 (H) 01/24/2020      Wt Readings from Last 3 Encounters:  04/03/20 265 lb 8 oz (120.4 kg)  01/04/19 254 lb (115.2 kg)  10/30/18 250 lb 12 oz (113.7 kg)       ASSESSMENT AND PLAN:  Essential hypertension, benign - Plan: EKG 12-Lead Reports having periodic lightheadedness concerning for orthostasis If symptoms do not improve with increased hydration, Would monitor blood pressure at home If numbers appear low, could cut the losartan HCTZ in half daily. Suspect he may have had more issues in the summertime in the hot weather  VARICOSE VEINS, LOWER EXTREMITIES Recommended compression hose Long discussion concerning previous surgical intervention in his thighs  Hypercholesteremia Long discussion concerning his hyperlipidemia CT scan reviewed showing no significant coronary calcifications, PAD. He would like to lose weight, exercise for a period of 6 months, recheck numbers at that time. If cholesterol continues to run high, he would be instituted starting low-dose Crestor.   CT coronary calcium score could be done in the future, 5 or 10 years for risk stratification. Test was discussed with him in detail   Total encounter time more than 45 minutes  Greater than 50% was spent in counseling and coordination of care with the patient   Disposition:   F/U  as needed   No orders of the defined types were placed in  this encounter.    Signed, Dossie Arbour, M.D., Ph.D. 06/22/2020  [facility] [place], [place] [phone]

## 2020-06-23 ENCOUNTER — Ambulatory Visit: Payer: 59 | Admitting: Cardiovascular Disease

## 2020-06-23 DIAGNOSIS — R0789 Other chest pain: Secondary | ICD-10-CM

## 2020-06-23 DIAGNOSIS — I10 Essential (primary) hypertension: Secondary | ICD-10-CM

## 2020-06-23 DIAGNOSIS — Z87891 Personal history of nicotine dependence: Secondary | ICD-10-CM

## 2020-06-23 DIAGNOSIS — E7800 Pure hypercholesterolemia, unspecified: Secondary | ICD-10-CM

## 2020-06-25 ENCOUNTER — Encounter: Payer: Self-pay | Admitting: Cardiovascular Disease

## 2021-04-24 ENCOUNTER — Telehealth: Payer: Self-pay | Admitting: Family Medicine

## 2021-04-24 NOTE — Telephone Encounter (Signed)
Please schedule CPE with fasting labs prior with Dr. Bedsole.  

## 2021-04-26 MED ORDER — ALBUTEROL SULFATE HFA 108 (90 BASE) MCG/ACT IN AERS: 2.0000 | INHALATION_SPRAY | Freq: Four times a day (QID) | RESPIRATORY_TRACT | 2 refills | Status: DC | PRN

## 2021-04-26 NOTE — Telephone Encounter (Signed)
Refill for ProAir sent as requested.  ?

## 2021-04-26 NOTE — Telephone Encounter (Signed)
Pt scheduled Physical for 5.9.23, was able to pick up advair, but needs  ? ?albuterol (PROAIR HFA) 108 (90 Base) MCG/ACT inhaler ? ?[id number] - [place], [place] - [address] AT [facility] OF [address] Phone:  [phone]  ?Fax:  [phone]  ?  ? ?

## 2021-05-02 ENCOUNTER — Other Ambulatory Visit: Payer: Self-pay | Admitting: Family Medicine

## 2021-05-06 IMAGING — DX DG FOOT COMPLETE 3+V*L*
3 series · 3 of 3 positions shown · non-contrast
Comparison: None.

CLINICAL DATA: Left lateral foot pain

EXAM:
LEFT FOOT - COMPLETE 3+ VIEW

[foot ap]
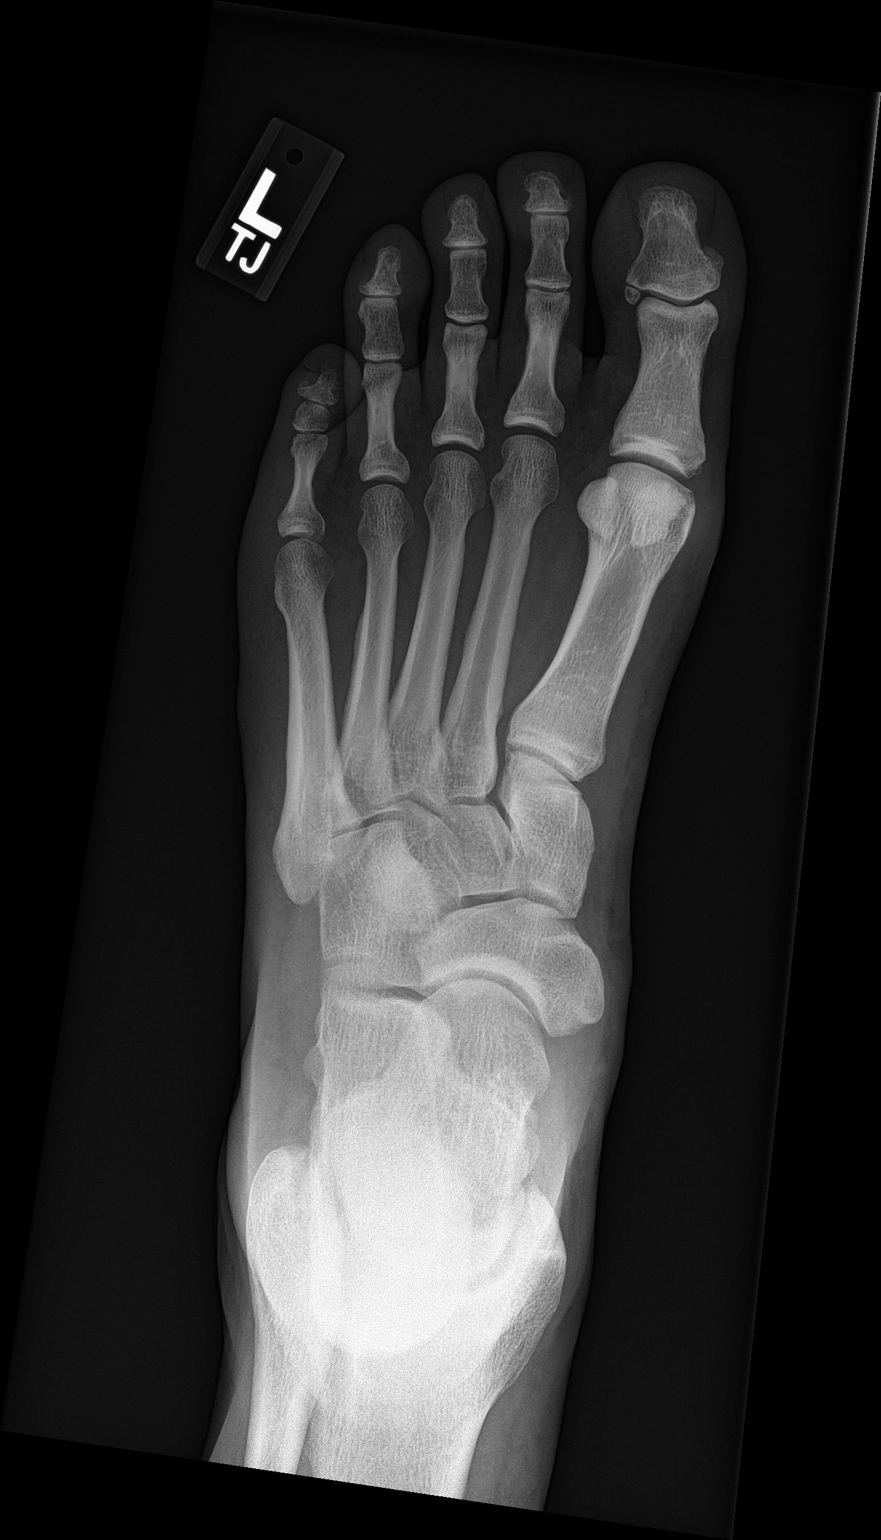

[foot obl]
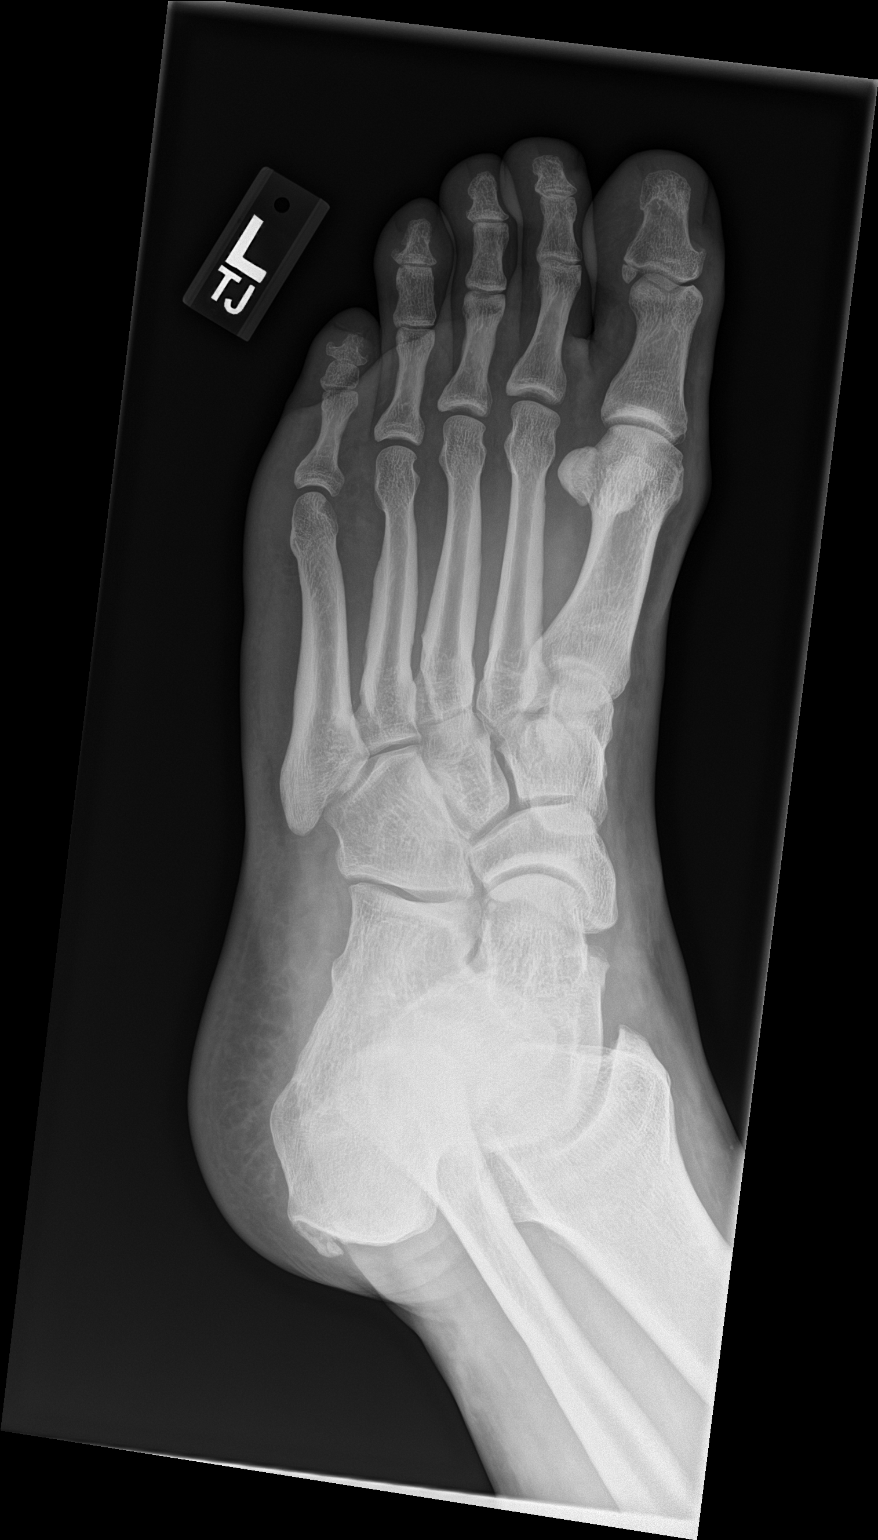

[foot lat]
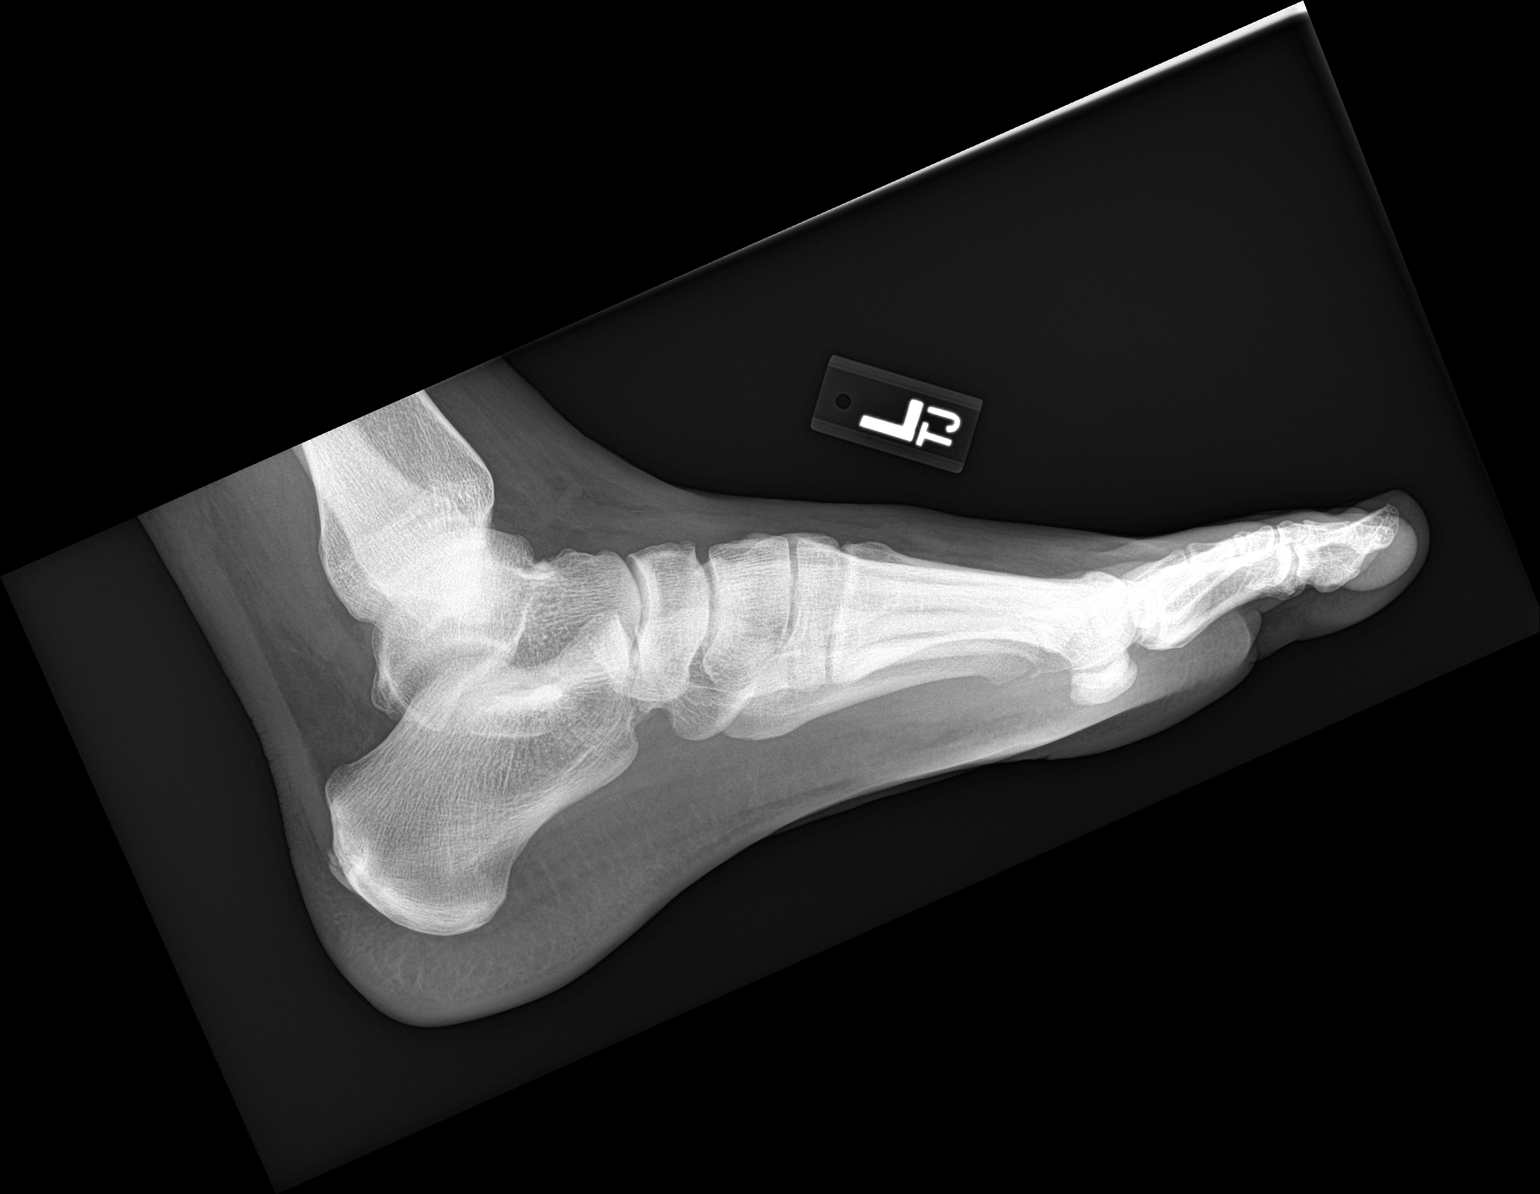

[3 of 3 positions shown; findings below may reference images not displayed]

FINDINGS: No acute fracture or dislocation of the left foot. Joint spaces are
well preserved. Soft tissues are unremarkable.
IMPRESSION: No acute fracture or dislocation of the left foot. Joint spaces are
well preserved.

## 2021-06-22 ENCOUNTER — Other Ambulatory Visit: Payer: Self-pay | Admitting: Family Medicine

## 2021-06-29 ENCOUNTER — Encounter: Payer: Self-pay | Admitting: Family Medicine

## 2021-06-29 ENCOUNTER — Ambulatory Visit (INDEPENDENT_AMBULATORY_CARE_PROVIDER_SITE_OTHER): Payer: 59 | Admitting: Family Medicine

## 2021-06-29 VITALS — BP 138/98 | HR 98 | Ht 74.0 in | Wt 273.0 lb

## 2021-06-29 DIAGNOSIS — I10 Essential (primary) hypertension: Secondary | ICD-10-CM

## 2021-06-29 DIAGNOSIS — Z0000 Encounter for general adult medical examination without abnormal findings: Secondary | ICD-10-CM

## 2021-06-29 DIAGNOSIS — R635 Abnormal weight gain: Secondary | ICD-10-CM | POA: Insufficient documentation

## 2021-06-29 DIAGNOSIS — I83813 Varicose veins of bilateral lower extremities with pain: Secondary | ICD-10-CM

## 2021-06-29 DIAGNOSIS — Z1159 Encounter for screening for other viral diseases: Secondary | ICD-10-CM

## 2021-06-29 DIAGNOSIS — R0683 Snoring: Secondary | ICD-10-CM | POA: Insufficient documentation

## 2021-06-29 DIAGNOSIS — E6609 Other obesity due to excess calories: Secondary | ICD-10-CM

## 2021-06-29 DIAGNOSIS — Z6834 Body mass index (BMI) 34.0-34.9, adult: Secondary | ICD-10-CM

## 2021-06-29 DIAGNOSIS — I8390 Asymptomatic varicose veins of unspecified lower extremity: Secondary | ICD-10-CM

## 2021-06-29 DIAGNOSIS — J4540 Moderate persistent asthma, uncomplicated: Secondary | ICD-10-CM

## 2021-06-29 DIAGNOSIS — E7800 Pure hypercholesterolemia, unspecified: Secondary | ICD-10-CM

## 2021-06-29 MED ORDER — ROSUVASTATIN CALCIUM 5 MG PO TABS: 5.0000 mg | ORAL_TABLET | ORAL | 3 refills | Status: DC

## 2021-06-29 MED ORDER — ALBUTEROL SULFATE HFA 108 (90 BASE) MCG/ACT IN AERS
2.0000 | INHALATION_SPRAY | Freq: Four times a day (QID) | RESPIRATORY_TRACT | 2 refills | Status: DC | PRN
Start: 2021-06-29 — End: 2022-07-12

## 2021-06-29 MED ORDER — ADVAIR DISKUS 100-50 MCG/ACT IN AEPB: INHALATION_SPRAY | RESPIRATORY_TRACT | 11 refills | Status: DC

## 2021-06-29 MED ORDER — LOSARTAN POTASSIUM 25 MG PO TABS: 25.0000 mg | ORAL_TABLET | Freq: Every day | ORAL | 3 refills | Status: DC

## 2021-06-29 NOTE — Assessment & Plan Note (Signed)
Chronic, worsening ? ?Given weight gain, BMI greater than 30, daytime fatigue and snoring, there is some concern for sleep apnea.  I will refer him to a sleep specialist for evaluation with home sleep study. ?

## 2021-06-29 NOTE — Patient Instructions (Addendum)
Referral  placed  to vascular and to sleep specialist for  sleep apnea. ? Keep working on healthy eating, cut back on beer intake and increase activity. ? ?

## 2021-06-29 NOTE — Assessment & Plan Note (Signed)
Acute, worsening.  Will evaluate thyroid function. ?

## 2021-06-29 NOTE — Assessment & Plan Note (Signed)
Chronic, worsening ? ?Causing pain.  He spends a lot of time in the car or sitting.  He wears compression hose but they are uncomfortable.  He has had previous varicosity treatments but would like a referral back to vascular for further recommendations.  I encouraged him to elevate his feet above his heart, wear the compression hose and work on weight loss ?

## 2021-06-29 NOTE — Assessment & Plan Note (Addendum)
Chronic on Crestor 5 mg daily,  due for reevaluation ?

## 2021-06-29 NOTE — Assessment & Plan Note (Signed)
Chronic, previously well controlled but currently off medication.  Will restart losartan 25 mg daily. ?Encouraged exercise, weight loss, healthy eating habits. ? ?

## 2021-06-29 NOTE — Progress Notes (Signed)
? ? Patient ID: Aaron Tyler, male    DOB: 01/03/1976, 46 y.o.   MRN: [id number] ? ?This visit was conducted in person. ? ?BP (!) 138/98   Pulse 98   Ht 6\' 2"  (1.88 m)   Wt 273 lb (123.8 kg)   SpO2 94%   BMI 35.05 kg/m?   ? ?CC:  ?Chief Complaint  ?Patient presents with  ? Annual Exam  ?  Physical , discuss medication , weight gain about 30lbs with 2-3 months , looks legs   ? ? ?Subjective:  ? ?HPI: ?Aaron NewcomerJohn R Dimitroff is a 46 y.o. male presenting on 06/29/2021 for Annual Exam (Physical , discuss medication , weight gain about 30lbs with 2-3 months , looks legs ) ? ? Weight  gain 30 lbs in last 2-3 months ?Body mass index is 35.05 kg/m?. ? He has been working out, lots of stress,  moderate diet. ?Wt Readings from Last 3 Encounters:  ?06/29/21 273 lb (123.8 kg)  ?04/03/20 265 lb 8 oz (120.4 kg)  ?01/04/19 254 lb (115.2 kg)  ? ? He continues with  having issues with varicose veins. Increase in size and soreness in last year. ? Occ wearing compression hose. ? ?Concerned with snoring, daytime fatigue. ? May have worsened with weight gain. ? ? GAD, stable control ? ?Hypertension:    Inadequate control since weight gain.. has been out of  losartan 25 mg daily in last 3 days ?BP Readings from Last 3 Encounters:  ?06/29/21 (!) 138/98  ?04/03/20 120/84  ?01/08/19 (!) 152/102  ?Using medication without problems or lightheadedness:  none ?Chest pain with exertion: none ?Edema:none ?Short of breath: none ?Average home BPs: 120/70 ?Other issues: ? ?Moderate persistent asthma ? ?Elevated Cholesterol :  Due for re-eval. ?Lab Results  ?Component Value Date  ? CHOL 211 (H) 01/24/2020  ? HDL 42.00 01/24/2020  ? LDLCALC 138 (H) 01/24/2020  ? LDLDIRECT 132.0 10/25/2018  ? TRIG 153.0 (H) 01/24/2020  ? CHOLHDL 5 01/24/2020  ? ? ?Using medications without problems: ?Muscle aches:  ?Diet compliance: ?Exercise: ?Other complaints: ? ? ?Relevant past medical, surgical, family and social history reviewed and updated as indicated. Interim medical  history since our last visit reviewed. ?Allergies and medications reviewed and updated. ?Outpatient Medications Prior to Visit  ?Medication Sig Dispense Refill  ? ADVAIR DISKUS 100-50 MCG/ACT AEPB INHALE 1 PUFF BY MOUTH EVERY DAY 60 each 0  ? albuterol (PROAIR HFA) 108 (90 Base) MCG/ACT inhaler Inhale 2 puffs into the lungs every 6 (six) hours as needed for wheezing. 8.5 g 2  ? losartan (COZAAR) 25 MG tablet TAKE 1 TABLET BY MOUTH EVERY DAY 90 tablet 0  ? mometasone (NASONEX) 50 MCG/ACT nasal spray Place 2 sprays into the nose daily.    ? rosuvastatin (CRESTOR) 5 MG tablet Take 1 tablet (5 mg total) by mouth once a week. 12 tablet 3  ? ?No facility-administered medications prior to visit.  ?  ? ?Per HPI unless specifically indicated in ROS section below ?Review of Systems  ?Constitutional:  Negative for fatigue and fever.  ?HENT:  Negative for ear pain.   ?Eyes:  Negative for pain.  ?Respiratory:  Negative for cough and shortness of breath.   ?Cardiovascular:  Negative for chest pain, palpitations and leg swelling.  ?Gastrointestinal:  Negative for abdominal pain.  ?Genitourinary:  Negative for dysuria.  ?Musculoskeletal:  Negative for arthralgias.  ?Neurological:  Negative for syncope, light-headedness and headaches.  ?Psychiatric/Behavioral:  Negative for dysphoric mood.   ?  Objective:  ?BP (!) 138/98   Pulse 98   Ht 6\' 2"  (1.88 m)   Wt 273 lb (123.8 kg)   SpO2 94%   BMI 35.05 kg/m?   ?Wt Readings from Last 3 Encounters:  ?06/29/21 273 lb (123.8 kg)  ?04/03/20 265 lb 8 oz (120.4 kg)  ?01/04/19 254 lb (115.2 kg)  ?  ?  ?Physical Exam ?Constitutional:   ?   General: He is not in acute distress. ?   Appearance: Normal appearance. He is well-developed. He is obese. He is not ill-appearing or toxic-appearing.  ?HENT:  ?   Head: Normocephalic and atraumatic.  ?   Right Ear: Hearing, tympanic membrane, ear canal and external ear normal.  ?   Left Ear: Hearing, tympanic membrane, ear canal and external ear normal.  ?    Nose: Nose normal.  ?   Mouth/Throat:  ?   Pharynx: Uvula midline.  ?Eyes:  ?   General: Lids are normal. Lids are everted, no foreign bodies appreciated.  ?   Conjunctiva/sclera: Conjunctivae normal.  ?   Pupils: Pupils are equal, round, and reactive to light.  ?Neck:  ?   Thyroid: No thyroid mass or thyromegaly.  ?   Vascular: No carotid bruit.  ?   Trachea: Trachea and phonation normal.  ?Cardiovascular:  ?   Rate and Rhythm: Normal rate and regular rhythm.  ?   Pulses: Normal pulses.  ?   Heart sounds: S1 normal and S2 normal. Heart sounds not distant. No murmur heard. ?  No friction rub. No gallop.  ?   Comments:  Bilateral severe varicosities ?Pulmonary:  ?   Effort: Pulmonary effort is normal. No respiratory distress.  ?   Breath sounds: Normal breath sounds. No wheezing, rhonchi or rales.  ?Abdominal:  ?   General: Bowel sounds are normal.  ?   Palpations: Abdomen is soft.  ?   Tenderness: There is no abdominal tenderness. There is no guarding or rebound.  ?   Hernia: No hernia is present.  ?Musculoskeletal:  ?   Cervical back: Normal range of motion and neck supple.  ?   Right lower leg: 1+ Edema present.  ?Lymphadenopathy:  ?   Cervical: No cervical adenopathy.  ?Skin: ?   General: Skin is warm and dry.  ?   Findings: No rash.  ?Neurological:  ?   Mental Status: He is alert.  ?   Cranial Nerves: No cranial nerve deficit.  ?   Sensory: No sensory deficit.  ?   Gait: Gait normal.  ?   Deep Tendon Reflexes: Reflexes are normal and symmetric.  ?Psychiatric:     ?   Speech: Speech normal.     ?   Behavior: Behavior normal.     ?   Thought Content: Thought content normal.     ?   Judgment: Judgment normal.  ? ?   ?Results for orders placed or performed in visit on 01/24/20  ?Comprehensive metabolic panel  ?Result Value Ref Range  ? Sodium 140 135 - 145 mEq/L  ? Potassium 4.3 3.5 - 5.1 mEq/L  ? Chloride 102 96 - 112 mEq/L  ? CO2 29 19 - 32 mEq/L  ? Glucose, Bld 88 70 - 99 mg/dL  ? BUN 15 6 - 23 mg/dL  ?  Creatinine, Ser 1.03 0.40 - 1.50 mg/dL  ? Total Bilirubin 1.0 0.2 - 1.2 mg/dL  ? Alkaline Phosphatase 45 39 - 117 U/L  ? AST 24  0 - 37 U/L  ? ALT 51 0 - 53 U/L  ? Total Protein 7.6 6.0 - 8.3 g/dL  ? Albumin 4.5 3.5 - 5.2 g/dL  ? GFR 89.13 >60.00 mL/min  ? Calcium 9.3 8.4 - 10.5 mg/dL  ?Lipid panel  ?Result Value Ref Range  ? Cholesterol 211 (H) 0 - 200 mg/dL  ? Triglycerides 153.0 (H) 0.0 - 149.0 mg/dL  ? HDL 42.00 >39.00 mg/dL  ? VLDL 30.6 0.0 - 40.0 mg/dL  ? LDL Cholesterol 138 (H) 0 - 99 mg/dL  ? Total CHOL/HDL Ratio 5   ? NonHDL 169.05   ? ? ? ?COVID 19 screen:  No recent travel or known exposure to COVID19 ?The patient denies respiratory symptoms of COVID 19 at this time. ?The importance of social distancing was discussed today.  ? ?Assessment and Plan ? ? The patient's preventative maintenance and recommended screening tests for an annual wellness exam were reviewed in full today. ?Brought up to date unless services declined. ? ?Counselled on the importance of diet, exercise, and its role in overall health and mortality. ?The patient's FH and SH was reviewed, including their home life, tobacco status, and drug and alcohol status.  ? ?Vaccines:  COVID x 2, Tdap due, PNA uptodate, flu update ?Prostate Cancer Screen: no early family hx ?Colon Cancer Screen:no early family hx  ?  ?  ?Smoking Status: none ? HIV screen:   refused ? ETOH: occ ? no drug use. ? ?Problem List Items Addressed This Visit   ? ? Abnormal weight gain  ?  Acute, worsening.  Will evaluate thyroid function. ? ?  ?  ? Relevant Orders  ? TSH  ? Testosterone Total,Free,Bio, Males  ? Class 1 obesity due to excess calories with serious comorbidity and body mass index (BMI) of 34.0 to 34.9 in adult  ? Relevant Orders  ? Hemoglobin A1c  ? Essential hypertension, benign  ?  Chronic, previously well controlled but currently off medication.  Will restart losartan 25 mg daily. ?Encouraged exercise, weight loss, healthy eating habits. ? ? ?  ?  ? Relevant  Medications  ? losartan (COZAAR) 25 MG tablet  ? rosuvastatin (CRESTOR) 5 MG tablet  ? Hypercholesteremia  ?  Chronic on Crestor 5 mg daily,  due for reevaluation ? ?  ?  ? Relevant Medications  ? losartan (COZAAR) 25

## 2021-06-29 NOTE — Assessment & Plan Note (Signed)
Chronic, stable control ? ?Advair 100/0 51 puff daily, using albuterol rescue inhaler rarely. ?Needs namebrand Advair because has side effects to generic. ? ?

## 2021-07-27 ENCOUNTER — Telehealth: Payer: Self-pay | Admitting: Cardiovascular Disease

## 2021-07-27 NOTE — Telephone Encounter (Signed)
3 attempts to schedule fu appt from recall list.   Deleting recall.   

## 2021-11-04 ENCOUNTER — Other Ambulatory Visit: Payer: Self-pay | Admitting: Family Medicine

## 2022-01-25 ENCOUNTER — Encounter: Payer: Self-pay | Admitting: Family Medicine

## 2022-01-25 ENCOUNTER — Telehealth (INDEPENDENT_AMBULATORY_CARE_PROVIDER_SITE_OTHER): Payer: Self-pay | Admitting: Family Medicine

## 2022-01-25 VITALS — Temp 96.5°F | Ht 74.0 in | Wt 272.0 lb

## 2022-01-25 DIAGNOSIS — R051 Acute cough: Secondary | ICD-10-CM | POA: Insufficient documentation

## 2022-01-25 DIAGNOSIS — J4540 Moderate persistent asthma, uncomplicated: Secondary | ICD-10-CM

## 2022-01-25 MED ORDER — GUAIFENESIN-CODEINE 100-10 MG/5ML PO SYRP: 5.0000 mL | ORAL_SOLUTION | Freq: Every evening | ORAL | 0 refills | Status: DC | PRN

## 2022-01-25 MED ORDER — AZITHROMYCIN 250 MG PO TABS: ORAL_TABLET | ORAL | 0 refills | Status: AC

## 2022-01-25 MED ORDER — PREDNISONE 20 MG PO TABS: ORAL_TABLET | ORAL | 0 refills | Status: DC

## 2022-01-25 NOTE — Assessment & Plan Note (Signed)
Acute, most likely allergic or viral. Treat with Zyrtec at bedtime as well as continued nasal saline irrigation. Encouraged COVID testing given he would be a candidate for oral antivirals if this would was found to be the cause of current symptoms. Will add prednisone taper and cough suppressant as needed. If not improving as expected by day 5-7 of illness will treat with azithromycin.  Return and ER precautions provided

## 2022-01-25 NOTE — Assessment & Plan Note (Addendum)
No current acute exacerbation. At increased risk for respiratory complications.

## 2022-01-25 NOTE — Progress Notes (Signed)
VIRTUAL VISIT A virtual visit is felt to be most appropriate for this patient at this time.   I connected with the patient on 01/25/22 at 12:00 PM EST by virtual telehealth platform and verified that I am speaking with the correct person using two identifiers.   I discussed the limitations, risks, security and privacy concerns of performing an evaluation and management service by  virtual telehealth platform and the availability of in person appointments. I also discussed with the patient that there may be a patient responsible charge related to this service. The patient expressed understanding and agreed to proceed.  Patient location: Home Provider Location: [name] Participants: Kerby Nora and Jarvis Newcomer   Chief Complaint  Patient presents with   Sinusitis    Nasal congestion, taking otc allergy medicine , no fever, coughing some sore throat started on Saturday     History of Present Illness: 46 year old male with history of moderate persistent asthma presents with new onset nasal congestion  Date of onset December 2  Initial symptoms included nasal congestion and sore throat. Has allergy to oak leaves and notes issue this time of year. Symptoms progressed to dry cough.  Minimal wheeze and SOB.  No fever, no chills  Body aches.  Sick contacts:  none COVID testing:  none    He has tried to treat with allergy medicine, Nasonex, Advair 2 puffs twice daily and albuterol as needed.  Netty pot.   No history of chronic lung disease such as asthma or COPD. Non-smoker.   COVID 19 screen No recent travel or known exposure to COVID19 The patient denies respiratory symptoms of COVID 19 at this time.  The importance of social distancing was discussed today.   Review of Systems  Constitutional:  Negative for chills and fever.  HENT:  Positive for congestion and sore throat. Negative for ear pain.   Eyes:  Negative for pain and redness.  Respiratory:  Positive for  cough. Negative for shortness of breath.   Cardiovascular:  Negative for chest pain, palpitations and leg swelling.  Gastrointestinal:  Negative for abdominal pain, blood in stool, constipation, diarrhea, nausea and vomiting.  Genitourinary:  Negative for dysuria.  Musculoskeletal:  Negative for falls and myalgias.  Skin:  Negative for rash.  Neurological:  Negative for dizziness.  Psychiatric/Behavioral:  Negative for depression. The patient is not nervous/anxious.       Past Medical History:  Diagnosis Date   ADD (attention deficit disorder) without hyperactivity    History of tobacco abuse quit 2006   Pollen allergies    Shingles    Unspecified asthma(493.90)    Varicose veins LEG PAIN OVER CALF VARICOSITIES    HX OF SCLEROTHERAPY     reports that he quit smoking about 20 years ago. His smoking use included cigarettes. He smoked an average of .25 packs per day. He has never used smokeless tobacco. He reports current alcohol use of about 6.0 standard drinks of alcohol per week. He reports that he does not use drugs.   Current Outpatient Medications:    ADVAIR DISKUS 100-50 MCG/ACT AEPB, INHALE 1 PUFF BY MOUTH EVERY DAY, Disp: 60 each, Rfl: 11   albuterol (PROAIR HFA) 108 (90 Base) MCG/ACT inhaler, Inhale 2 puffs into the lungs every 6 (six) hours as needed for wheezing., Disp: 8.5 g, Rfl: 2   losartan (COZAAR) 25 MG tablet, Take 1 tablet (25 mg total) by mouth daily., Disp: 90 tablet, Rfl: 3   mometasone (NASONEX) 50  MCG/ACT nasal spray, Place 2 sprays into the nose daily., Disp: , Rfl:    rosuvastatin (CRESTOR) 5 MG tablet, Take 1 tablet (5 mg total) by mouth once a week., Disp: 12 tablet, Rfl: 3   Observations/Objective: Temperature (!) 96.5 F (35.8 C), height 6\' 2"  (1.88 m), weight 272 lb (123.4 kg).  Physical Exam  Physical Exam Constitutional:      General: The patient is not in acute distress. Pulmonary:     Effort: Pulmonary effort is normal. No respiratory distress.   Neurological:     Mental Status: The patient is alert and oriented to person, place, and time.  Psychiatric:        Mood and Affect: Mood normal.        Behavior: Behavior normal.   Assessment and Plan Problem List Items Addressed This Visit     Acute cough - Primary    Acute, most likely allergic or viral. Treat with Zyrtec at bedtime as well as continued nasal saline irrigation. Encouraged COVID testing given he would be a candidate for oral antivirals if this would was found to be the cause of current symptoms. Will add prednisone taper and cough suppressant as needed. If not improving as expected by day 5-7 of illness will treat with azithromycin.  Return and ER precautions provided      Moderate persistent asthma    No current acute exacerbation. At increased risk for respiratory complications.      Relevant Medications   predniSONE (DELTASONE) 20 MG tablet   Meds ordered this encounter  Medications   predniSONE (DELTASONE) 20 MG tablet    Sig: 3 tabs by mouth daily x 3 days, then 2 tabs by mouth daily x 2 days then 1 tab by mouth daily x 2 days    Dispense:  15 tablet    Refill:  0   azithromycin (ZITHROMAX) 250 MG tablet    Sig: Take 2 tablets on day 1, then 1 tablet daily on days 2 through 5,    Dispense:  6 tablet    Refill:  0    Fill if not improving by day 5-7 of illness, void after February 21, 2022   guaiFENesin-codeine (ROBITUSSIN AC) 100-10 MG/5ML syrup    Sig: Take 5-10 mLs by mouth at bedtime as needed for cough.    Dispense:  180 mL    Refill:  0      I discussed the assessment and treatment plan with the patient. The patient was provided an opportunity to ask questions and all were answered. The patient agreed with the plan and demonstrated an understanding of the instructions.   The patient was advised to call back or seek an in-person evaluation if the symptoms worsen or if the condition fails to improve as anticipated.     February 23, 2022, MD

## 2022-03-03 ENCOUNTER — Telehealth: Payer: Self-pay | Admitting: *Deleted

## 2022-03-03 MED ORDER — FLUTICASONE-SALMETEROL 100-50 MCG/ACT IN AEPB: 1.0000 | INHALATION_SPRAY | Freq: Two times a day (BID) | RESPIRATORY_TRACT | 2 refills | Status: DC

## 2022-03-03 NOTE — Telephone Encounter (Signed)
Received fax from [facility] stating Advair 100-50 mg is on back order.  Please okay generic to be filled or send to another non-[facility] pharmacy.  No [facility] have in stock.  Okay to send Wixela 100-50?  If okay,  please sign order below.

## 2022-03-03 NOTE — Telephone Encounter (Signed)
Okay to change to generic Advair.  Let patient know.

## 2022-03-04 NOTE — Telephone Encounter (Signed)
Left message for Aaron Tyler that Aaron Tyler notified Aaron Tyler that his Advair 100-50 mg is on national backorder so we have sent him in a Rx for the generic Wixela instead. I asked that he call me back with any questions or concerns.

## 2022-03-21 ENCOUNTER — Encounter: Payer: Self-pay | Admitting: Family Medicine

## 2022-03-21 DIAGNOSIS — R7989 Other specified abnormal findings of blood chemistry: Secondary | ICD-10-CM

## 2022-04-29 ENCOUNTER — Other Ambulatory Visit: Payer: Self-pay | Admitting: Family Medicine

## 2022-04-29 MED ORDER — MELOXICAM 15 MG PO TABS: 15.0000 mg | ORAL_TABLET | Freq: Every day | ORAL | 0 refills | Status: DC

## 2022-05-13 ENCOUNTER — Telehealth: Payer: Self-pay | Admitting: Family Medicine

## 2022-05-13 ENCOUNTER — Other Ambulatory Visit (INDEPENDENT_AMBULATORY_CARE_PROVIDER_SITE_OTHER): Payer: 59

## 2022-05-13 DIAGNOSIS — R635 Abnormal weight gain: Secondary | ICD-10-CM

## 2022-05-13 DIAGNOSIS — E7800 Pure hypercholesterolemia, unspecified: Secondary | ICD-10-CM

## 2022-05-13 DIAGNOSIS — Z1159 Encounter for screening for other viral diseases: Secondary | ICD-10-CM

## 2022-05-13 LAB — TSH: TSH: 5.32 u[IU]/mL (ref 0.35–5.50)

## 2022-05-13 LAB — LIPID PANEL
Cholesterol: 242 mg/dL — ABNORMAL HIGH (ref 0–200)
HDL: 48.3 mg/dL (ref >39.00)
LDL Cholesterol: 154 mg/dL — ABNORMAL HIGH (ref 0–99)
NonHDL: 193.61
Total CHOL/HDL Ratio: 5
Triglycerides: 199 mg/dL — ABNORMAL HIGH (ref 0.0–149.0)
VLDL: 39.8 mg/dL (ref 0.0–40.0)

## 2022-05-13 LAB — T4, FREE: Free T4: 0.78 ng/dL (ref 0.60–1.60)

## 2022-05-13 LAB — COMPREHENSIVE METABOLIC PANEL
ALT: 67 U/L — ABNORMAL HIGH (ref 0–53)
AST: 31 U/L (ref 0–37)
Albumin: 4.5 g/dL (ref 3.5–5.2)
Alkaline Phosphatase: 52 U/L (ref 39–117)
BUN: 20 mg/dL (ref 6–23)
CO2: 31 mEq/L (ref 19–32)
Calcium: 9.6 mg/dL (ref 8.4–10.5)
Chloride: 101 mEq/L (ref 96–112)
Creatinine, Ser: 1.03 mg/dL (ref 0.40–1.50)
GFR: 87.71 mL/min (ref >60.00)
Glucose, Bld: 99 mg/dL (ref 70–99)
Potassium: 4.6 mEq/L (ref 3.5–5.1)
Sodium: 142 mEq/L (ref 135–145)
Total Bilirubin: 0.5 mg/dL (ref 0.2–1.2)
Total Protein: 7.6 g/dL (ref 6.0–8.3)

## 2022-05-13 LAB — HEMOGLOBIN A1C: Hgb A1c MFr Bld: 6.2 % (ref 4.6–6.5)

## 2022-05-13 LAB — T3, FREE: T3, Free: 3.9 pg/mL (ref 2.3–4.2)

## 2022-05-13 NOTE — Telephone Encounter (Signed)
Aaron Tyler notified as instructed by telephone.  He is scheduled to see Dr. Diona Browner in the office on 05/17/22 at 9:00 am.  Can discuss further at his appointment, but in the mean time, patient states he will stop the supplements.

## 2022-05-13 NOTE — Telephone Encounter (Signed)
Pt drop off paperwork for PCP to review stated wanted PCP to know what he's taking . Did not want it to effect his lab . Place in provider's folder

## 2022-05-13 NOTE — Telephone Encounter (Signed)
Added labs for [employer]

## 2022-05-13 NOTE — Telephone Encounter (Signed)
Paperwork on supplements placed in Dr. Rometta Emery office in box to review.

## 2022-05-13 NOTE — Telephone Encounter (Signed)
Reviewed, should not have any negative effect on labs, but does contain significant amount of caffeine so he should follow heart rate and blood pressure at home on this supplement.  Could cause symptoms of anxiety tremor or insomnia.  If elevated at home or in office at visit may need to stop this medication

## 2022-05-13 NOTE — Progress Notes (Signed)
No critical labs need to be addressed urgently. We will discuss labs in detail at upcoming office visit.   

## 2022-05-16 LAB — TESTOSTERONE TOTAL,FREE,BIO, MALES
Albumin: 4.4 g/dL (ref 3.6–5.1)
Sex Hormone Binding: 14 nmol/L (ref 10–50)
Testosterone: 164 ng/dL — ABNORMAL LOW (ref 250–827)

## 2022-05-16 LAB — HEPATITIS C ANTIBODY: Hepatitis C Ab: NONREACTIVE

## 2022-05-17 ENCOUNTER — Other Ambulatory Visit: Payer: Self-pay | Admitting: Family Medicine

## 2022-05-17 ENCOUNTER — Ambulatory Visit (INDEPENDENT_AMBULATORY_CARE_PROVIDER_SITE_OTHER): Payer: 59 | Admitting: Family Medicine

## 2022-05-17 ENCOUNTER — Encounter: Payer: Self-pay | Admitting: Family Medicine

## 2022-05-17 VITALS — BP 118/80 | HR 96 | Temp 97.5°F | Ht 74.0 in | Wt 297.1 lb

## 2022-05-17 DIAGNOSIS — Z6838 Body mass index (BMI) 38.0-38.9, adult: Secondary | ICD-10-CM

## 2022-05-17 DIAGNOSIS — E7800 Pure hypercholesterolemia, unspecified: Secondary | ICD-10-CM | POA: Diagnosis not present

## 2022-05-17 DIAGNOSIS — R7989 Other specified abnormal findings of blood chemistry: Secondary | ICD-10-CM | POA: Diagnosis not present

## 2022-05-17 DIAGNOSIS — J4540 Moderate persistent asthma, uncomplicated: Secondary | ICD-10-CM | POA: Diagnosis not present

## 2022-05-17 DIAGNOSIS — M549 Dorsalgia, unspecified: Secondary | ICD-10-CM

## 2022-05-17 DIAGNOSIS — E66812 Obesity, class 2: Secondary | ICD-10-CM

## 2022-05-17 MED ORDER — VALIUM 5 MG PO TABS: 5.0000 mg | ORAL_TABLET | Freq: Every evening | ORAL | 0 refills | Status: DC | PRN

## 2022-05-17 MED ORDER — DIAZEPAM 5 MG PO TABS: 5.0000 mg | ORAL_TABLET | Freq: Every day | ORAL | 0 refills | Status: DC

## 2022-05-17 MED ORDER — CYCLOBENZAPRINE HCL 10 MG PO TABS: 10.0000 mg | ORAL_TABLET | Freq: Every evening | ORAL | 0 refills | Status: DC | PRN

## 2022-05-17 MED ORDER — ROSUVASTATIN CALCIUM 5 MG PO TABS: 5.0000 mg | ORAL_TABLET | ORAL | 3 refills | Status: DC

## 2022-05-17 MED ORDER — FLUTICASONE FUROATE-VILANTEROL 100-25 MCG/ACT IN AEPB: 1.0000 | INHALATION_SPRAY | Freq: Every day | RESPIRATORY_TRACT | 11 refills | Status: DC

## 2022-05-17 NOTE — Patient Instructions (Addendum)
Start home physical therapy and heat on upper back.  Stop meloxicam, change to diclofenac twice a day.  Use muscle relaxant at night.  Can consider massage.   Increase Crestor to 3 days a week, if not SE. Return for lab only recheck in 3 month to recheck cholesterol.   Please stop at the lab to have labs drawn.   Start trial of [employer] and call/ message results.

## 2022-05-17 NOTE — Progress Notes (Signed)
Patient ID: Aaron Tyler, male    DOB: 11-15-1975, 47 y.o.   MRN: [id number]  This visit was conducted in person.  BP 118/80   Pulse 96   Temp (!) 97.5 F (36.4 C) (Temporal)   Ht 6\' 2"  (1.88 m)   Wt 297 lb 2 oz (134.8 kg)   SpO2 95%   BMI 38.15 kg/m    CC:  Chief Complaint  Patient presents with   Obesity   Shoulder Pain    Left   Labs Only    Subjective:   HPI: Aaron Tyler is a 47 y.o. male presenting on 05/17/2022 for Obesity, Shoulder Pain (Left), and Labs Only  Back pain intermittent upper back pain, radiates to left anterior chest.  He feels it is related to weight lifting injury, has pain there off and on for 20 years.  This flare has been going on for 5 weeks... started after sleeping wrong  Right shoulder OA. Causing pain enough to keep him up at night... Advil 800 mg does not help much.  No  numbness on weakness in left arm.  Meloxicam 15 mg daily x 2 weeks has helped some.. sent in on MyChart.  2. Weight gain despite working on lifestyle  changes.[name] eating 1800 calories a day for 2-3 months.  Has not been able to get to gym given upper back issues as well as very heavy work schedule.  Reviewed labs in detail. TSH borderline.  Low testosterone.. 164. Wt Readings from Last 3 Encounters:  05/17/22 297 lb 2 oz (134.8 kg)  01/25/22 272 lb (123.4 kg)  06/29/21 273 lb (123.8 kg)   Body mass index is 38.15 kg/m.  Elevated Cholesterol: .last  On crestor 5 mg  2 times a week. Using medications without problems: Muscle aches:  Diet compliance: Exercise: Other complaints: The 10-year ASCVD risk score (Arnett DK, et al., 2019) is: 3%   Values used to calculate the score:     Age: 85 years     Sex: Male     Is Non-Hispanic African American: No     Diabetic: No     Tobacco smoker: No     Systolic Blood Pressure: 123456 mmHg     Is BP treated: Yes     HDL Cholesterol: 48.3 mg/dL     Total Cholesterol: 242 mg/dL   Hypertension:   Has now stopped supplement  with caffeine. BP Readings from Last 3 Encounters:  05/17/22 118/80  06/29/21 (!) 138/98  04/03/20 120/84   Using medication without problems or lightheadedness:  Chest pain with exertion: Edema: Short of breath: Average home BPs: Other issues:     He has tried and failed Symbicort and wixella ( advair generic) for asthma... control is poor on these.  Will try a trial of Breo Ellipta.  If fails this prior authorization  for name brand Advair.  Relevant past medical, surgical, family and social history reviewed and updated as indicated. Interim medical history since our last visit reviewed. Allergies and medications reviewed and updated. Outpatient Medications Prior to Visit  Medication Sig Dispense Refill   albuterol (PROAIR HFA) 108 (90 Base) MCG/ACT inhaler Inhale 2 puffs into the lungs every 6 (six) hours as needed for wheezing. 8.5 g 2   losartan (COZAAR) 25 MG tablet Take 1 tablet (25 mg total) by mouth daily. 90 tablet 3   meloxicam (MOBIC) 15 MG tablet Take 1 tablet (15 mg total) by mouth daily. 30 tablet 0  mometasone (NASONEX) 50 MCG/ACT nasal spray Place 2 sprays into the nose daily.     ADVAIR DISKUS 100-50 MCG/ACT AEPB Inhale 1 puff into the lungs 2 (two) times daily.     fluticasone-salmeterol (ADVAIR) 100-50 MCG/ACT AEPB Inhale 1 puff into the lungs 2 (two) times daily.     fluticasone-salmeterol (WIXELA INHUB) 100-50 MCG/ACT AEPB Inhale 1 puff into the lungs 2 (two) times daily. 60 each 2   rosuvastatin (CRESTOR) 5 MG tablet Take 1 tablet (5 mg total) by mouth once a week. 12 tablet 3   guaiFENesin-codeine (ROBITUSSIN AC) 100-10 MG/5ML syrup Take 5-10 mLs by mouth at bedtime as needed for cough. 180 mL 0   predniSONE (DELTASONE) 20 MG tablet 3 tabs by mouth daily x 3 days, then 2 tabs by mouth daily x 2 days then 1 tab by mouth daily x 2 days 15 tablet 0   No facility-administered medications prior to visit.     Per HPI unless specifically indicated in ROS section  below Review of Systems  Constitutional:  Positive for fatigue. Negative for fever.  HENT:  Negative for ear pain.   Eyes:  Negative for pain.  Respiratory:  Negative for cough and shortness of breath.   Cardiovascular:  Negative for chest pain, palpitations and leg swelling.  Gastrointestinal:  Negative for abdominal pain.  Genitourinary:  Negative for dysuria.  Musculoskeletal:  Negative for arthralgias.  Neurological:  Negative for syncope, light-headedness and headaches.  Psychiatric/Behavioral:  Positive for sleep disturbance. Negative for dysphoric mood.    Objective:  BP 118/80   Pulse 96   Temp (!) 97.5 F (36.4 C) (Temporal)   Ht 6\' 2"  (1.88 m)   Wt 297 lb 2 oz (134.8 kg)   SpO2 95%   BMI 38.15 kg/m   Wt Readings from Last 3 Encounters:  05/17/22 297 lb 2 oz (134.8 kg)  01/25/22 272 lb (123.4 kg)  06/29/21 273 lb (123.8 kg)      Physical Exam Constitutional:      Appearance: He is well-developed. He is obese.     Comments: Fatigued appearing  HENT:     Head: Normocephalic.     Right Ear: Hearing normal.     Left Ear: Hearing normal.     Nose: Nose normal.  Neck:     Thyroid: No thyroid mass or thyromegaly.     Vascular: No carotid bruit.     Trachea: Trachea normal.  Cardiovascular:     Rate and Rhythm: Normal rate and regular rhythm.     Pulses: Normal pulses.     Heart sounds: Heart sounds not distant. No murmur heard.    No friction rub. No gallop.     Comments: No peripheral edema Pulmonary:     Effort: Pulmonary effort is normal. No respiratory distress.     Breath sounds: Normal breath sounds.  Skin:    General: Skin is warm and dry.     Findings: No rash.  Psychiatric:        Speech: Speech normal.        Behavior: Behavior normal.        Thought Content: Thought content normal.       Results for orders placed or performed in visit on 05/13/22  T3, free  Result Value Ref Range   T3, Free 3.9 2.3 - 4.2 pg/mL  T4, free  Result Value Ref  Range   Free T4 0.78 0.60 - 1.60 ng/dL  TSH  Result Value  Ref Range   TSH 5.32 0.35 - 5.50 uIU/mL  Hemoglobin A1c  Result Value Ref Range   Hgb A1c MFr Bld 6.2 4.6 - 6.5 %  Comprehensive metabolic panel  Result Value Ref Range   Sodium 142 135 - 145 mEq/L   Potassium 4.6 3.5 - 5.1 mEq/L   Chloride 101 96 - 112 mEq/L   CO2 31 19 - 32 mEq/L   Glucose, Bld 99 70 - 99 mg/dL   BUN 20 6 - 23 mg/dL   Creatinine, Ser 1.03 0.40 - 1.50 mg/dL   Total Bilirubin 0.5 0.2 - 1.2 mg/dL   Alkaline Phosphatase 52 39 - 117 U/L   AST 31 0 - 37 U/L   ALT 67 (H) 0 - 53 U/L   Total Protein 7.6 6.0 - 8.3 g/dL   Albumin 4.5 3.5 - 5.2 g/dL   GFR 87.71 >60.00 mL/min   Calcium 9.6 8.4 - 10.5 mg/dL  Lipid panel  Result Value Ref Range   Cholesterol 242 (H) 0 - 200 mg/dL   Triglycerides 199.0 (H) 0.0 - 149.0 mg/dL   HDL 48.30 >39.00 mg/dL   VLDL 39.8 0.0 - 40.0 mg/dL   LDL Cholesterol 154 (H) 0 - 99 mg/dL   Total CHOL/HDL Ratio 5    NonHDL 193.61   Testosterone Total,Free,Bio, Males  Result Value Ref Range   Testosterone 164 (L) 250 - 827 ng/dL   Albumin 4.4 3.6 - 5.1 g/dL   Sex Hormone Binding 14 10 - 50 nmol/L   Testosterone, Free See below 46.0 - 224.0 pg/mL   Testosterone, Bioavailable  110.0 - 575.0 ng/dL  Hepatitis C antibody  Result Value Ref Range   Hepatitis C Ab NON-REACTIVE NON-REACTIVE    Assessment and Plan  Low testosterone in male Assessment & Plan: New finding Will verify second a.m. test and consider referral to neurology.   Upper back pain on left side Assessment & Plan: Chronic recurrent, with acute flare. Appears to be most consistent with musculoskeletal strain and possible past injury and muscle back.  Physical exam not consistent today with cervical radiculopathy. Some improvement with meloxicam 15 mg p.o. daily  Will change to diclofenac 75 mg p.o. twice daily, start massage and upper back stretching. Use cyclobenzaprine 5 to 10 mg p.o. nightly for muscle  spasm. If not improving consider referral to formal physical therapy and x-ray neck.   Hypercholesteremia Assessment & Plan: Chronic, worsening control  Encouraged regular exercise, weight management and low-cholesterol diet. Continue Crestor  5 mg but increase to 3 days a week. Reevaluate with labs in 3 months.  Orders: -     Testosterone Total,Free,Bio, Males  Moderate persistent asthma without complication Assessment & Plan: Chronic, inadequate control  He has had poor control with trial of Symbicort and Wixela.  Advair works much better for him.  Per insurance we will try Breo Ellipta inhaler for trial.  He will contact me with update via MyChart in the next few weeks.   Class 2 severe obesity due to excess calories with serious comorbidity and body mass index (BMI) of 38.0 to 38.9 in adult [facility]) Assessment & Plan: Chronic, worsening  Continue weight gain despite working on diet with 1800-calorie restriction,.  Thyroid function within normal range.  Low testosterone and seeing which could be contributing. Patient also with heavy work schedule and stress, no time for regular physical activity. Patient also with inadequate sleep and sometimes sleeping through the night instead of during the day.  Encouraged patient  to decrease work schedule, work on stress reduction, get back on healthy day night schedule give time for adequate sleep.   Other orders -     Cyclobenzaprine HCl; Take 1 tablet (10 mg total) by mouth at bedtime as needed for muscle spasms.  Dispense: 15 tablet; Refill: 0 -     Rosuvastatin Calcium; Take 1 tablet (5 mg total) by mouth every Monday, Wednesday, and Friday.  Dispense: 12 tablet; Refill: 3 -     Fluticasone Furoate-Vilanterol; Inhale 1 puff into the lungs daily.  Dispense: 1 each; Refill: 11    Return in about 3 months (around 08/17/2022) for lab only.   Eliezer Lofts, MD

## 2022-05-17 NOTE — Assessment & Plan Note (Signed)
Chronic, worsening control  Encouraged regular exercise, weight management and low-cholesterol diet. Continue Crestor  5 mg but increase to 3 days a week. Reevaluate with labs in 3 months.

## 2022-05-17 NOTE — Telephone Encounter (Signed)
Called [facility] to clarify the medication he is requesting.  He is requesting Brand Name Valium.  He will willing to pay the extra for brand name.  DAW will need to be marked on Rx.

## 2022-05-17 NOTE — Addendum Note (Signed)
Addended by: Carter Kitten on: 05/17/2022 05:05 PM   Modules accepted: Orders

## 2022-05-17 NOTE — Assessment & Plan Note (Signed)
Chronic recurrent, with acute flare. Appears to be most consistent with musculoskeletal strain and possible past injury and muscle back.  Physical exam not consistent today with cervical radiculopathy. Some improvement with meloxicam 15 mg p.o. daily  Will change to diclofenac 75 mg p.o. twice daily, start massage and upper back stretching. Use cyclobenzaprine 5 to 10 mg p.o. nightly for muscle spasm. If not improving consider referral to formal physical therapy and x-ray neck.

## 2022-05-17 NOTE — Telephone Encounter (Signed)
Patient was seen today and prescribed  cyclobenzaprine (FLEXERIL) 10 MG tablet , he called in today asking if Volum the named brand can be sent in for him instead?He said that is the only relaxer that actually helps him.He would like the named brand and not generic,he's willing to pay the extra money for it.

## 2022-05-17 NOTE — Telephone Encounter (Addendum)
Called [facility] and cancelled the cyclobenzaprine and diazepam prescriptions.  Dr. Diona Browner will resend over brand name Valium Rx.  Left message for Aaron Tyler that Dr. Diona Browner has sent in Valium 5 mg p.o. nightly as needed muscle spasms.  This is a controlled substance and is not a good long-term medication.  Advised to make sure  to work on upper back stretches massage and heat.  I also let him know that the pharmacist at [facility] thought she would have to place on order for the Valium so he may want to check with the pharmacy before going to pick it up.

## 2022-05-17 NOTE — Addendum Note (Signed)
Addended by: Eliezer Lofts E on: 05/17/2022 04:59 PM   Modules accepted: Orders

## 2022-05-17 NOTE — Progress Notes (Signed)
No critical labs need to be addressed urgently. We will discuss labs in detail at upcoming office visit.   

## 2022-05-17 NOTE — Assessment & Plan Note (Signed)
Chronic, inadequate control  He has had poor control with trial of Symbicort and Wixela.  Advair works much better for him.  Per insurance we will try Breo Ellipta inhaler for trial.  He will contact me with update via MyChart in the next few weeks.

## 2022-05-17 NOTE — Telephone Encounter (Signed)
Call pharmacy to cancel cyclobenzaprine. Let patient know that I will send in Valium 5 mg p.o. nightly as needed muscle spasms.  This is a controlled substance and is not a good long-term medication. Make sure he knows to work on upper back stretches massage and heat.

## 2022-05-17 NOTE — Addendum Note (Signed)
Addended by: Eliezer Lofts E on: 05/17/2022 04:56 PM   Modules accepted: Orders

## 2022-05-17 NOTE — Assessment & Plan Note (Signed)
Chronic, worsening  Continue weight gain despite working on diet with 1800-calorie restriction,.  Thyroid function within normal range.  Low testosterone and seeing which could be contributing. Patient also with heavy work schedule and stress, no time for regular physical activity. Patient also with inadequate sleep and sometimes sleeping through the night instead of during the day.  Encouraged patient to decrease work schedule, work on stress reduction, get back on healthy day night schedule give time for adequate sleep.

## 2022-05-17 NOTE — Assessment & Plan Note (Signed)
New finding Will verify second a.m. test and consider referral to neurology.

## 2022-05-18 LAB — TESTOSTERONE TOTAL,FREE,BIO, MALES
Albumin: 4.6 g/dL (ref 3.6–5.1)
Sex Hormone Binding: 13 nmol/L (ref 10–50)
Testosterone: 177 ng/dL — ABNORMAL LOW (ref 250–827)

## 2022-05-20 ENCOUNTER — Other Ambulatory Visit (HOSPITAL_COMMUNITY): Payer: Self-pay

## 2022-05-25 ENCOUNTER — Encounter: Payer: Self-pay | Admitting: *Deleted

## 2022-05-26 ENCOUNTER — Other Ambulatory Visit: Payer: Self-pay | Admitting: Family Medicine

## 2022-05-26 NOTE — Telephone Encounter (Signed)
Last office visit 05/17/2022 low testosterone, upper back pain, asthma and obesity.  Last refilled 04/29/22 for #30 with no refills.  Next Appt: No future appointments.

## 2022-07-02 ENCOUNTER — Other Ambulatory Visit: Payer: Self-pay | Admitting: Family Medicine

## 2022-07-11 ENCOUNTER — Telehealth: Payer: Self-pay | Admitting: Family Medicine

## 2022-07-11 MED ORDER — ADVAIR DISKUS 100-50 MCG/ACT IN AEPB: 1.0000 | INHALATION_SPRAY | Freq: Two times a day (BID) | RESPIRATORY_TRACT | 5 refills | Status: DC

## 2022-07-11 MED ORDER — LOSARTAN POTASSIUM 25 MG PO TABS: 25.0000 mg | ORAL_TABLET | Freq: Every day | ORAL | 3 refills | Status: DC

## 2022-07-11 NOTE — Telephone Encounter (Signed)
Patient called in and stated that he needs his inhaler refilled. He stated that he has always used Advair and not Breo and he would like that one to be removed from his medication list. He stated that in order to get his Advair inhaler he needs a PA also. Please advise. Thank you!

## 2022-07-11 NOTE — Telephone Encounter (Signed)
Prescription Request  07/11/2022  LOV: 05/17/2022  What is the name of the medication or equipment? losartan (COZAAR) 25 MG tablet   Have you contacted your pharmacy to request a refill? No   Which pharmacy would you like this sent to?  [id number] [place], [place] - [address] AT [facility] [address] [facility] [address] [place] [address] Phone: [phone] Fax: [phone]      Patient notified that their request is being sent to the clinical staff for review and that they should receive a response within 2 business days.   Please advise at Mobile [phone] (mobile)

## 2022-07-11 NOTE — Telephone Encounter (Signed)
PA for Advair 110-50 mcg completed on CoverMyMeds.  Sent to [facility] for review.  Can take up to 72 hours for a decision.

## 2022-07-11 NOTE — Telephone Encounter (Signed)
Refill sent as requested. 

## 2022-07-11 NOTE — Addendum Note (Signed)
Addended by: Damita Lack on: 07/11/2022 02:42 PM   Modules accepted: Orders

## 2022-07-11 NOTE — Telephone Encounter (Signed)
PA approved through 07/11/2023.  Ross notified of this approval via telephone.

## 2022-07-11 NOTE — Telephone Encounter (Signed)
Spoke with Aaron Tyler.  He states he is not able to tolerate the Breo.  He states it makes him feel like he is having an allergic reaction in his chest.  He ask that we send in Rx for Advair and submit a PA.

## 2022-07-12 ENCOUNTER — Other Ambulatory Visit: Payer: Self-pay | Admitting: Family Medicine

## 2022-07-15 ENCOUNTER — Ambulatory Visit (INDEPENDENT_AMBULATORY_CARE_PROVIDER_SITE_OTHER): Payer: 59 | Admitting: Family Medicine

## 2022-07-15 ENCOUNTER — Encounter: Payer: Self-pay | Admitting: Family Medicine

## 2022-07-15 VITALS — BP 120/80 | HR 100 | Temp 97.7°F | Ht 74.0 in | Wt 298.0 lb

## 2022-07-15 DIAGNOSIS — L03113 Cellulitis of right upper limb: Secondary | ICD-10-CM | POA: Insufficient documentation

## 2022-07-15 MED ORDER — TRAMADOL HCL 50 MG PO TABS: 50.0000 mg | ORAL_TABLET | Freq: Four times a day (QID) | ORAL | 0 refills | Status: DC | PRN

## 2022-07-15 MED ORDER — CEFTRIAXONE SODIUM 1 G IJ SOLR
1.0000 g | Freq: Once | INTRAMUSCULAR | Status: AC
Start: 2022-07-15 — End: 2022-07-15
  Administered 2022-07-15: 1 g via INTRAMUSCULAR

## 2022-07-15 NOTE — Progress Notes (Addendum)
Patient ID: Aaron Tyler, male    DOB: 03-22-1975, 47 y.o.   MRN: [id number]  This visit was conducted in person.  BP 120/80 (BP Location: Left Arm, Patient Position: Sitting, Cuff Size: Large)   Pulse 100   Temp 97.7 F (36.5 C) (Temporal)   Ht 6\' 2"  (1.88 m)   Wt 298 lb (135.2 kg)   SpO2 96%   BMI 38.26 kg/m    CC:  Chief Complaint  Patient presents with   Insect Bite                                                                                                                                                                                                                                                                                                                                                                        Follow up UC Visit from 07/11/22                                                                                                                            Subjective:   HPI: Aaron Tyler is a 47 y.o. male presenting on 07/15/2022 for Insect Bite (  Follow up UC Visit from 07/11/22/                                                                                                                       )  Reviewed office visit from 07/11/2022 for cellulitis/abscess upper extremity right  I and D performed. No culture taken.  No fever since.  Pain is improving, size smaller.  Still some redness.  Small discharge.. clear and bloody. Warm compresses  On doxycycline 100 mg twice daily now  on second course.. has 2 more days.    No known bug bite.  Did get tattoo near the area 2 weeks prior Advil not helping with pain...  requests some tramadol to use to help sleep at night.  No numbness and weakness in hand.      Relevant past medical, surgical, family and social history reviewed and updated as indicated. Interim medical history since our last visit reviewed. Allergies and medications reviewed and updated. Outpatient Medications Prior to Visit  Medication Sig Dispense Refill   ADVAIR DISKUS 100-50 MCG/ACT AEPB Inhale 1 puff into the lungs 2 (two) times daily. 60 each 5   albuterol (VENTOLIN HFA) 108 (90 Base) MCG/ACT inhaler INHALE 2 PUFFS INTO THE LUNGS EVERY 6 HOURS AS NEEDED FOR WHEEZING 8.5 g 2   doxycycline (VIBRA-TABS) 100 MG tablet Take 100 mg by mouth 2 (two) times daily.     losartan (COZAAR) 25 MG tablet Take 1 tablet (25 mg total) by mouth daily. 90 tablet 3   meloxicam (MOBIC) 15 MG tablet TAKE 1 TABLET(15 MG) BY MOUTH DAILY 30 tablet 0   mometasone (NASONEX) 50 MCG/ACT nasal spray Place 2 sprays into the nose daily.     rosuvastatin (CRESTOR) 5 MG tablet Take 1 tablet (5 mg total) by mouth every Monday, Wednesday, and Friday. 12 tablet 3   VALIUM 5 MG tablet Take 1 tablet (5 mg total) by mouth at bedtime as needed for muscle spasms. 15 tablet 0   traMADol (ULTRAM) 50 MG tablet Take 50 mg by mouth every 6 (six) hours as needed. (Patient not taking: Reported on 07/15/2022)     No facility-administered medications prior to visit.     Per HPI unless specifically indicated in ROS section below Review of Systems  Constitutional:  Negative for fatigue and fever.  HENT:  Negative for ear pain.   Eyes:  Negative for pain.  Respiratory:  Negative for cough and shortness of breath.   Cardiovascular:  Negative for chest pain, palpitations and leg swelling.  Gastrointestinal:  Negative for abdominal pain.  Genitourinary:  Negative for dysuria.  Musculoskeletal:  Negative for arthralgias.  Neurological:  Negative for syncope, light-headedness and headaches.  Psychiatric/Behavioral:  Negative for  dysphoric mood.    Objective:  BP 120/80 (BP Location: Left Arm, Patient Position: Sitting, Cuff Size: Large)   Pulse 100   Temp 97.7 F (36.5 C) (Temporal)   Ht 6\' 2"  (1.88 m)   Wt 298 lb (135.2  kg)   SpO2 96%   BMI 38.26 kg/m   Wt Readings from Last 3 Encounters:  07/15/22 298 lb (135.2 kg)  05/17/22 297 lb 2 oz (134.8 kg)  01/25/22 272 lb (123.4 kg)      Physical Exam Constitutional:      Appearance: He is well-developed.  HENT:     Head: Normocephalic.     Right Ear: Hearing normal.     Left Ear: Hearing normal.     Nose: Nose normal.  Neck:     Thyroid: No thyroid mass or thyromegaly.     Vascular: No carotid bruit.     Trachea: Trachea normal.  Cardiovascular:     Rate and Rhythm: Normal rate and regular rhythm.     Pulses: Normal pulses.     Heart sounds: Heart sounds not distant. No murmur heard.    No friction rub. No gallop.     Comments: No peripheral edema Pulmonary:     Effort: Pulmonary effort is normal. No respiratory distress.     Breath sounds: Normal breath sounds.  Skin:    General: Skin is warm and dry.     Findings: No rash.  Psychiatric:        Speech: Speech normal.        Behavior: Behavior normal.        Thought Content: Thought content normal.       Results for orders placed or performed in visit on 05/17/22  Testosterone Total,Free,Bio, Males  Result Value Ref Range   Testosterone 177 (L) 250 - 827 ng/dL   Albumin 4.6 3.6 - 5.1 g/dL   Sex Hormone Binding 13 10 - 50 nmol/L   Testosterone, Free See below 46.0 - 224.0 pg/mL   Testosterone, Bioavailable  110.0 - 575.0 ng/dL    Assessment and Plan  Right arm cellulitis Assessment & Plan: Acute, status post incision and drainage urgent care.  Unfortunately no wound culture taken. Area is significantly improved but is no longer draining. No area of fluctuance, but area still fairly tender. He is on second course of doxycycline.  He has 2 more days of this and will complete this.   Will administer an injection of ceftriaxone 1 g in office today. He will restart warm compresses to encourage continued draining to avoid reaccumulation of infection. He can use tramadol as needed for pain.  Return and ER precautions provided.  Encouraged him strongly to see a provider over the weekend if symptoms or not continuing to improve, new fever, new tingling or cold hands.   Other orders -     traMADol HCl; Take 1 tablet (50 mg total) by mouth every 6 (six) hours as needed.  Dispense: 10 tablet; Refill: 0   Close follow-up. Return in about 5 days (around 07/20/2022) for  follow up abscess.  He states he cannot come in any earlier given work commitments.  Kerby Nora, MD

## 2022-07-15 NOTE — Assessment & Plan Note (Signed)
Acute, status post incision and drainage urgent care.  Unfortunately no wound culture taken. Area is significantly improved but is no longer draining. No area of fluctuance, but area still fairly tender. He is on second course of doxycycline.  He has 2 more days of this and will complete this.  Will administer an injection of ceftriaxone 1 g in office today. He will restart warm compresses to encourage continued draining to avoid reaccumulation of infection. He can use tramadol as needed for pain.  Return and ER precautions provided.  Encouraged him strongly to see a provider over the weekend if symptoms or not continuing to improve, new fever, new tingling or cold hands.

## 2022-07-15 NOTE — Patient Instructions (Signed)
Complete doxycycline.  Restart warm compresses.  Sanitize personal items as discussed, use antibacterial soap on body.

## 2022-07-15 NOTE — Addendum Note (Signed)
Addended by: Damita Lack on: 07/15/2022 09:33 AM   Modules accepted: Orders

## 2022-07-20 ENCOUNTER — Ambulatory Visit: Payer: 59 | Admitting: Family Medicine

## 2022-07-21 ENCOUNTER — Encounter: Payer: Self-pay | Admitting: Family Medicine

## 2022-08-13 ENCOUNTER — Other Ambulatory Visit: Payer: Self-pay | Admitting: Family Medicine

## 2022-08-15 NOTE — Telephone Encounter (Signed)
Please call and schedule fasting lab appointment to recheck lipid panel.

## 2022-08-15 NOTE — Telephone Encounter (Signed)
Lvm for patient tcb and schedule 

## 2022-09-30 ENCOUNTER — Other Ambulatory Visit: Payer: Self-pay | Admitting: Family Medicine

## 2022-09-30 NOTE — Telephone Encounter (Signed)
Please call and schedule lab only appointment to recheck cholesterol level.  He was suppose to come back in June to recheck.  Send back to me once scheduled to refill medication.

## 2022-09-30 NOTE — Telephone Encounter (Signed)
Lvm for patient tcb and schedule 

## 2022-10-03 NOTE — Telephone Encounter (Signed)
Lvmtcb, sent mychart message  

## 2022-10-03 NOTE — Telephone Encounter (Signed)
Last office visit 07/15/22 for Insect Bite.  Patient was suppose to come back in June to recheck Lipid Panel.  Front office has tried to reach him to schedule lab appointment without a return call.  Refill?

## 2022-10-04 NOTE — Progress Notes (Addendum)
@Patient  ID: Aaron Tyler, male    DOB: 1975-05-29, 47 y.o.   MRN: [id number]  Chief Complaint  Patient presents with   Consult    No sleep study Patient complains of snoring     Referring provider: Excell Seltzer, MD  HPI: 47 year old male, former smoker quit in 2003.  Past medical history significant for hypertension, asthma, allergic rhinitis, pulmonary nodule, anxiety, obesity  10/05/2022 Patient presents today for sleep consult due to witnessed apnea, self referred. He has symptoms of restless and nonrestorative sleep.  He is under quite a bit of stress at work.  He runs a large business, works in Automotive engineer.  He starts his day between 4 and 5 AM.  He is trying to go to more of a domestic schedule.  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  Typical bedtime is 10 PM. He can fall asleep pretty much any time during the day. It takes him less than 30 seconds to fall asleep. He still dreams a lot. He wakes up around 1 AM. Eats at night. Falls back to sleep 2-3:30am. Starts his day at 5am. He works from home. Hx drug use in the past/ amphetamines. Starting to make some life-style changes . Seeing nutritionist. No previous sleep study. No overt sign for narcolepsy, cataplexy or sleepwalking.  Sleep questionnaire Symptoms- restless sleep, non-restorative sleep, snores, woken up short of breath  Prior sleep study- None  Bedtime- 10pm  Time to fall asleep- 30 sec Nocturnal awakenings- 4-5 times  Out of bed/start of day- 5am Weight changes- 50 lbs  Do you operate heavy machinery- No  Do you currently wear CPAP- No Do you current wear oxygen- No Epworth- 12  No Known Allergies  Immunization History  Administered Date(s) Administered   Influenza Whole 12/22/2005   MMR 11/25/2010   PFIZER(Purple Top)SARS-COV-2 Vaccination 06/28/2019, 07/30/2019   Pneumococcal Polysaccharide-23 07/29/2009   Td 02/22/2003   Tdap 01/22/2016    Past Medical History:  Diagnosis Date    ADD (attention deficit disorder) without hyperactivity    History of tobacco abuse quit 2006   Pollen allergies    Shingles    Unspecified asthma(493.90)    Varicose veins LEG PAIN OVER CALF VARICOSITIES    HX OF SCLEROTHERAPY     Tobacco History: Social History   Tobacco Use  Smoking Status Former   Current packs/day: 0.00   Types: Cigarettes   Quit date: 02/21/2001   Years since quitting: 21.6  Smokeless Tobacco Never  Tobacco Comments   pt. smoked in 2003 for about 3 months on a social basis   Counseling given: Not Answered Tobacco comments: pt. smoked in 2003 for about 3 months on a social basis   Outpatient Medications Prior to Visit  Medication Sig Dispense Refill   ADVAIR DISKUS 100-50 MCG/ACT AEPB Inhale 1 puff into the lungs 2 (two) times daily. 60 each 5   albuterol (VENTOLIN HFA) 108 (90 Base) MCG/ACT inhaler INHALE 2 PUFFS INTO THE LUNGS EVERY 6 HOURS AS NEEDED FOR WHEEZING 8.5 g 2   losartan (COZAAR) 25 MG tablet Take 1 tablet (25 mg total) by mouth daily. 90 tablet 3   mometasone (NASONEX) 50 MCG/ACT nasal spray Place 2 sprays into the nose daily.     rosuvastatin (CRESTOR) 5 MG tablet TAKE 1 TABLET(5 MG) BY MOUTH 1 TIME A WEEK 12 tablet 0   VALIUM 5 MG tablet Take 1 tablet (5 mg total) by mouth at bedtime as  needed for muscle spasms. 15 tablet 0   meloxicam (MOBIC) 15 MG tablet TAKE 1 TABLET(15 MG) BY MOUTH DAILY 30 tablet 0   traMADol (ULTRAM) 50 MG tablet Take 1 tablet (50 mg total) by mouth every 6 (six) hours as needed. 10 tablet 0   No facility-administered medications prior to visit.   Review of Systems  Review of Systems  Constitutional:  Positive for fatigue.  HENT:  Positive for congestion.   Respiratory: Negative.    Cardiovascular: Negative.   Psychiatric/Behavioral:  Positive for sleep disturbance.    Physical Exam  BP 132/80 (BP Location: Left Arm, Patient Position: Sitting, Cuff Size: Normal)   Pulse (!) 108   Ht 6\' 3"  (1.905 m)   Wt  (!) 309 lb 9.6 oz (140.4 kg)   SpO2 95%   BMI 38.70 kg/m  Physical Exam Constitutional:      General: He is not in acute distress.    Appearance: Normal appearance. He is obese. He is not ill-appearing.  HENT:     Head: Normocephalic and atraumatic.     Mouth/Throat:     Mouth: Mucous membranes are moist.     Pharynx: Oropharynx is clear.     Comments: Mallampati class II Cardiovascular:     Rate and Rhythm: Normal rate and regular rhythm.  Pulmonary:     Effort: Pulmonary effort is normal.     Breath sounds: Normal breath sounds. No wheezing, rhonchi or rales.     Comments: CTA; no wheezing  Skin:    General: Skin is warm and dry.  Neurological:     General: No focal deficit present.     Mental Status: He is alert and oriented to person, place, and time. Mental status is at baseline.  Psychiatric:        Mood and Affect: Mood normal.        Behavior: Behavior normal.        Thought Content: Thought content normal.        Judgment: Judgment normal.     Comments: Hyper-verbal       Lab Results:  CBC    Component Value Date/Time   WBC 7.2 01/04/2019 1625   RBC 4.93 01/04/2019 1625   HGB 15.7 01/04/2019 1625   HCT 43.5 01/04/2019 1625   PLT 299 01/04/2019 1625   MCV 88.2 01/04/2019 1625   MCH 31.8 01/04/2019 1625   MCHC 36.1 (H) 01/04/2019 1625   RDW 12.1 01/04/2019 1625   LYMPHSABS 2,218 01/04/2019 1625   MONOABS 0.5 08/15/2014 0935   EOSABS 554 (H) 01/04/2019 1625   BASOSABS 58 01/04/2019 1625    BMET    Component Value Date/Time   NA 142 05/13/2022 0834   K 4.6 05/13/2022 0834   CL 101 05/13/2022 0834   CO2 31 05/13/2022 0834   GLUCOSE 99 05/13/2022 0834   BUN 20 05/13/2022 0834   CREATININE 1.03 05/13/2022 0834   CALCIUM 9.6 05/13/2022 0834   GFRNONAA 74.80 05/06/2008 1646   GFRAA 91 01/02/2008 1020    BNP No results found for: "BNP"  ProBNP No results found for: "PROBNP"  Imaging: No results found.   Assessment & Plan:   Loud  snoring - Patient has symptoms of restless/ non-restorative sleep and loud snoring.  Concern patient could have underlying obstructive sleep apnea, needs home sleep study to evaluate.  We reviewed risks of untreated sleep apnea including cardiac arrhythmias, pulm hypertension, diabetes or stroke.  We also discussed treatment options including  weight loss, oral appliance, CPAP therapy referral to ENT for possible surgical options.  Encourage side sleeping position and weight loss.  Advised against driving if experiencing excessive daytime sleepiness fatigue.  Follow-up 1 to 2 weeks after sleep study review results and treatment options if needed.  Moderate persistent asthma - Stable; No active respiratory symptoms - Continue Advair twice daily as directed   Allergic rhinitis - Allergic to pet dander - Continue Nasonex nasal spry   Glenford Bayley, NP 10/10/2022

## 2022-10-04 NOTE — Patient Instructions (Incomplete)

## 2022-10-05 ENCOUNTER — Ambulatory Visit (INDEPENDENT_AMBULATORY_CARE_PROVIDER_SITE_OTHER): Payer: 59 | Admitting: Primary Care

## 2022-10-05 ENCOUNTER — Encounter: Payer: Self-pay | Admitting: Primary Care

## 2022-10-05 VITALS — BP 132/80 | HR 108 | Ht 75.0 in | Wt 309.6 lb

## 2022-10-05 DIAGNOSIS — R0683 Snoring: Secondary | ICD-10-CM | POA: Diagnosis not present

## 2022-10-10 DIAGNOSIS — R0683 Snoring: Secondary | ICD-10-CM | POA: Insufficient documentation

## 2022-10-10 NOTE — Assessment & Plan Note (Addendum)
-   Patient has symptoms of restless/ non-restorative sleep and loud snoring.  Concern patient could have underlying obstructive sleep apnea, needs home sleep study to evaluate.  We reviewed risks of untreated sleep apnea including cardiac arrhythmias, pulm hypertension, diabetes or stroke.  We also discussed treatment options including weight loss, oral appliance, CPAP therapy referral to ENT for possible surgical options.  Encourage side sleeping position and weight loss.  Advised against driving if experiencing excessive daytime sleepiness fatigue.  Follow-up 1 to 2 weeks after sleep study review results and treatment options if needed.

## 2022-10-10 NOTE — Assessment & Plan Note (Addendum)
-   Stable; No active respiratory symptoms - Continue Advair twice daily as directed

## 2022-10-10 NOTE — Assessment & Plan Note (Addendum)
-   Allergic to pet dander - Continue Nasonex nasal spry

## 2023-06-22 ENCOUNTER — Telehealth: Admitting: Internal Medicine

## 2023-06-22 ENCOUNTER — Ambulatory Visit: Payer: Self-pay

## 2023-06-22 ENCOUNTER — Encounter: Payer: Self-pay | Admitting: Internal Medicine

## 2023-06-22 VITALS — BP 125/81 | HR 81 | Ht 75.0 in | Wt 275.0 lb

## 2023-06-22 DIAGNOSIS — B349 Viral infection, unspecified: Secondary | ICD-10-CM

## 2023-06-22 MED ORDER — MELOXICAM 15 MG PO TABS
15.0000 mg | ORAL_TABLET | Freq: Every day | ORAL | 0 refills | Status: DC
Start: 2023-06-22 — End: 2023-07-19

## 2023-06-22 NOTE — Telephone Encounter (Signed)
  Chief Complaint: sinus congestion Symptoms: green nasal discharge, body aches Frequency: this AM Pertinent Negatives: Patient denies fever, cough, CP, SOB Disposition: [] ED /[] Urgent Care (no appt availability in office) / [x] Appointment(In office/virtual)/ []  [facility]/ [] Home Care/ [] Refused Recommended Disposition /[]  Mobile Bus/ []  Follow-up with PCP Additional Notes: Pt wife calling c/o sinus congestion. Wife endorses that pt gets sinus infection 1-2x a year, and these sx are similar. Wife reports green discharge and body aches. Pt has been using nasal rinses with no relief and is taking OTC medication with minimal relief. Scheduled patient per protocol on Jun 22, 2023 virtually with alternate PC per wife request. Caregiver verbalized understanding and to call back with worsening symptoms/questions.        Copied from CRM [id number]. Topic: Clinical - Red Word Triage >> Jun 22, 2023 12:27 PM Kita Perish H wrote: [facility] that prompted transfer to Nurse Triage: Patient having some nose drainage green mucus and congestion, body aches Reason for Disposition  [1] Using nasal washes and pain medicine > 24 hours AND [2] sinus pain (around cheekbone or eye) persists  Answer Assessment - Initial Assessment Questions 1. LOCATION: "Where does it hurt?"      Head hurts in general 2. ONSET: "When did the sinus pain start?"  (e.g., hours, days)      This AM 3. SEVERITY: "How bad is the pain?"   (Scale 1-10; mild, moderate or severe)   - MILD (1-3): doesn't interfere with normal activities    - MODERATE (4-7): interferes with normal activities (e.g., work or school) or awakens from sleep   - SEVERE (8-10): excruciating pain and patient unable to do any normal activities        moderate 4. RECURRENT SYMPTOM: "Have you ever had sinus problems before?" If Yes, ask: "When was the last time?" and "What happened that time?"      Yes, annual sinus infections 5. NASAL CONGESTION:  "Is the nose blocked?" If Yes, ask: "Can you open it or must you breathe through your mouth?"     no 6. NASAL DISCHARGE: "Do you have discharge from your nose?" If so ask, "What color?"     green 7. FEVER: "Do you have a fever?" If Yes, ask: "What is it, how was it measured, and when did it start?"      Denies 8. OTHER SYMPTOMS: "Do you have any other symptoms?" (e.g., sore throat, cough, earache, difficulty breathing)     Body aches Denies all above  Protocols used: Sinus Pain or Congestion-A-AH

## 2023-06-22 NOTE — Progress Notes (Signed)
 Subjective:    Patient ID: Aaron Tyler, male    DOB: 07/17/1975, 48 y.o.   MRN: [id number]  HPI Video virtual visit due to fever Identification done Reviewed limitations and billing and he gave consent Participants--patient in his home and I am in my office  Symptoms started last night around 11:30PM Noticed joint pains and congestion ?nasal infection Low grade fever early his morning--then up to 100.3 Advil did help this--400mg  Ongoing aches and pains--like flu No sig cough Some drainage No SOB  Usually when he gets this--needs antibiotic and prednisone   Just returned from [place]---3 days ago Wife has no symptoms Has been back to work  Current Outpatient Medications on File Prior to Visit  Medication Sig Dispense Refill   ADVAIR  DISKUS 100-50 MCG/ACT AEPB Inhale 1 puff into the lungs 2 (two) times daily. 60 each 5   albuterol  (VENTOLIN  HFA) 108 (90 Base) MCG/ACT inhaler INHALE 2 PUFFS INTO THE LUNGS EVERY 6 HOURS AS NEEDED FOR WHEEZING 8.5 g 2   losartan  (COZAAR ) 25 MG tablet Take 1 tablet (25 mg total) by mouth daily. 90 tablet 3   mometasone (NASONEX) 50 MCG/ACT nasal spray Place 2 sprays into the nose daily.     rosuvastatin  (CRESTOR ) 5 MG tablet TAKE 1 TABLET(5 MG) BY MOUTH 1 TIME A WEEK 12 tablet 0   VALIUM  5 MG tablet Take 1 tablet (5 mg total) by mouth at bedtime as needed for muscle spasms. 15 tablet 0   No current facility-administered medications on file prior to visit.    No Known Allergies  Past Medical History:  Diagnosis Date   ADD (attention deficit disorder) without hyperactivity    History of tobacco abuse quit 2006   Pollen allergies    Shingles    Unspecified asthma(493.90)    Varicose veins LEG PAIN OVER CALF VARICOSITIES    HX OF SCLEROTHERAPY     Past Surgical History:  Procedure Laterality Date   ENDOVENOUS ABLATION SAPHENOUS VEIN W/ LASER  06-16-2011   right greater saphenous vein and stab phlebectomy >20 right leg   ENDOVENOUS  ABLATION SAPHENOUS VEIN W/ LASER  09-15-2011   left greater saphenous vein and stab phlebectomies>20 Incisions left leg   ESOPHAGOGASTRODUODENOSCOPY     ?achalasia   surgical removal of splinter (foreign body) right foot  06-2011   right foot   VARICOSE VEIN SURGERY     sclerotherapy    Family History  Problem Relation Age of Onset   Heart disease Mother    Heart attack Father 48   Hypertension Father    Hyperlipidemia Brother    Cancer Brother        throat   Hyperlipidemia Brother    Hyperlipidemia Brother    Cancer Maternal Grandfather        lung    Social History   Socioeconomic History   Marital status: Married    Spouse name: Not on file   Number of children: Not on file   Years of education: Not on file   Highest education level: Not on file  Occupational History   Occupation: Brewing technologist and investments    Employer: [employer]  Tobacco Use   Smoking status: Former    Current packs/day: 0.00    Types: Cigarettes    Quit date: 02/21/2001    Years since quitting: 22.3   Smokeless tobacco: Never   Tobacco comments:    pt. smoked in 2003 for about 3 months on a social basis  Vaping Use   Vaping status: Never Used  Substance and Sexual Activity   Alcohol use: Yes    Alcohol/week: 6.0 standard drinks of alcohol    Types: 6 Cans of beer per week    Comment: every other weekend   Drug use: No   Sexual activity: Not on file  Other Topics Concern   Not on file  Social History Narrative   Regular exercise---yes, not regularly but sometimes 1-2 times per week      Diet--- chick salad, organics, + h2o, some fast food, occasionally skips breakfast         Social Drivers of Corporate investment banker Strain: Not on file  Food Insecurity: Not on file  Transportation Needs: Not on file  Physical Activity: Not on file  Stress: Not on file  Social Connections: Not on file  Intimate Partner Violence: Not on file   Review of Systems No change in taste or  smell Slight nausea--no vomiting Didn't test for COVID    Objective:   Physical Exam Constitutional:      General: He is not in acute distress.    Comments: Looks mildly ill  Pulmonary:     Effort: Pulmonary effort is normal. No respiratory distress.  Neurological:     Mental Status: He is alert.            Assessment & Plan:

## 2023-06-22 NOTE — Assessment & Plan Note (Signed)
 Classic presentation with sig body pain, fever and feeling bad Could be COVID--he could test--but I wouldn't treat Flu season is mostly over--but he was [place] the airport 2 days before symptoms started Prone to sinus infections but too soon for this  Will refill meloxicam  for the pain Can also take tylenol  Fluids/rest If worsens next week with sinus symptoms--would treat with antibiotic and prednisone 

## 2023-06-22 NOTE — Telephone Encounter (Signed)
 Noted---I will check him at today's visit

## 2023-07-11 ENCOUNTER — Telehealth: Payer: Self-pay | Admitting: *Deleted

## 2023-07-11 ENCOUNTER — Other Ambulatory Visit: Payer: Self-pay | Admitting: Family Medicine

## 2023-07-11 DIAGNOSIS — R635 Abnormal weight gain: Secondary | ICD-10-CM

## 2023-07-11 DIAGNOSIS — E7800 Pure hypercholesterolemia, unspecified: Secondary | ICD-10-CM

## 2023-07-11 NOTE — Telephone Encounter (Signed)
Please schedule CPE with fasting labs prior with Dr. Ermalene Searing.  Please send back to me once scheduled to refill medication.

## 2023-07-11 NOTE — Telephone Encounter (Signed)
-----   Message from Gerry Krone sent at 07/11/2023  9:43 AM EDT ----- Regarding: Lab orders for, [facility] 5.22.25 Patient is scheduled for CPX labs, please order future labs, Thanks , Anselmo Kings

## 2023-07-11 NOTE — Telephone Encounter (Signed)
 Spoke to pt, scheduled cpe for 07/19/23

## 2023-07-13 ENCOUNTER — Ambulatory Visit: Payer: Self-pay | Admitting: Family Medicine

## 2023-07-13 ENCOUNTER — Other Ambulatory Visit (INDEPENDENT_AMBULATORY_CARE_PROVIDER_SITE_OTHER)

## 2023-07-13 ENCOUNTER — Other Ambulatory Visit

## 2023-07-13 DIAGNOSIS — E7800 Pure hypercholesterolemia, unspecified: Secondary | ICD-10-CM | POA: Diagnosis not present

## 2023-07-13 DIAGNOSIS — R635 Abnormal weight gain: Secondary | ICD-10-CM | POA: Diagnosis not present

## 2023-07-13 LAB — COMPREHENSIVE METABOLIC PANEL WITH GFR
ALT: 20 U/L (ref 0–53)
AST: 16 U/L (ref 0–37)
Albumin: 4.6 g/dL (ref 3.5–5.2)
Alkaline Phosphatase: 40 U/L (ref 39–117)
BUN: 14 mg/dL (ref 6–23)
CO2: 31 meq/L (ref 19–32)
Calcium: 9.2 mg/dL (ref 8.4–10.5)
Chloride: 102 meq/L (ref 96–112)
Creatinine, Ser: 1.03 mg/dL (ref 0.40–1.50)
GFR: 86.99 mL/min (ref >60.00)
Glucose, Bld: 84 mg/dL (ref 70–99)
Potassium: 4.4 meq/L (ref 3.5–5.1)
Sodium: 138 meq/L (ref 135–145)
Total Bilirubin: 1.2 mg/dL (ref 0.2–1.2)
Total Protein: 7.3 g/dL (ref 6.0–8.3)

## 2023-07-13 LAB — LIPID PANEL
Cholesterol: 206 mg/dL — ABNORMAL HIGH (ref 0–200)
HDL: 33 mg/dL — ABNORMAL LOW (ref >39.00)
LDL Cholesterol: 140 mg/dL — ABNORMAL HIGH (ref 0–99)
NonHDL: 172.65
Total CHOL/HDL Ratio: 6
Triglycerides: 164 mg/dL — ABNORMAL HIGH (ref 0.0–149.0)
VLDL: 32.8 mg/dL (ref 0.0–40.0)

## 2023-07-13 LAB — T4, FREE: Free T4: 0.95 ng/dL (ref 0.60–1.60)

## 2023-07-13 LAB — T3, FREE: T3, Free: 3.7 pg/mL (ref 2.3–4.2)

## 2023-07-13 LAB — TSH: TSH: 2.38 u[IU]/mL (ref 0.35–5.50)

## 2023-07-13 LAB — HEMOGLOBIN A1C: Hgb A1c MFr Bld: 6 % (ref 4.6–6.5)

## 2023-07-13 NOTE — Progress Notes (Signed)
 No critical labs need to be addressed urgently. We will discuss labs in detail at upcoming office visit.

## 2023-07-14 LAB — TESTOSTERONE TOTAL,FREE,BIO, MALES
Albumin: 4.5 g/dL (ref 3.6–5.1)
Sex Hormone Binding: 13 nmol/L (ref 10–50)
Testosterone, Bioavailable: 1908.9 ng/dL — ABNORMAL HIGH (ref 110.0–575.0)
Testosterone, Free: 928.2 pg/mL — ABNORMAL HIGH (ref 46.0–224.0)
Testosterone: 2481 ng/dL — ABNORMAL HIGH (ref 250–827)

## 2023-07-19 ENCOUNTER — Other Ambulatory Visit: Payer: Self-pay | Admitting: Internal Medicine

## 2023-07-19 ENCOUNTER — Encounter: Admitting: Family Medicine

## 2023-07-19 NOTE — Telephone Encounter (Signed)
 Last office visit 06/22/23 with Dr. Letvak for acute viral syndrome.  Last refilled 06/22/2023 for #30 with no refills.  Next Appt: CPE 07/21/2023.

## 2023-07-21 ENCOUNTER — Ambulatory Visit: Admitting: Family Medicine

## 2023-07-21 ENCOUNTER — Encounter: Payer: Self-pay | Admitting: Family Medicine

## 2023-07-21 VITALS — BP 110/66 | HR 96 | Temp 98.2°F | Ht 73.25 in | Wt 279.4 lb

## 2023-07-21 DIAGNOSIS — I10 Essential (primary) hypertension: Secondary | ICD-10-CM

## 2023-07-21 DIAGNOSIS — E7800 Pure hypercholesterolemia, unspecified: Secondary | ICD-10-CM | POA: Diagnosis not present

## 2023-07-21 DIAGNOSIS — Z0000 Encounter for general adult medical examination without abnormal findings: Secondary | ICD-10-CM

## 2023-07-21 DIAGNOSIS — Z8249 Family history of ischemic heart disease and other diseases of the circulatory system: Secondary | ICD-10-CM

## 2023-07-21 DIAGNOSIS — Z1211 Encounter for screening for malignant neoplasm of colon: Secondary | ICD-10-CM

## 2023-07-21 DIAGNOSIS — J4540 Moderate persistent asthma, uncomplicated: Secondary | ICD-10-CM

## 2023-07-21 DIAGNOSIS — R7989 Other specified abnormal findings of blood chemistry: Secondary | ICD-10-CM

## 2023-07-21 NOTE — Assessment & Plan Note (Addendum)
Chronic, stable control 

## 2023-07-21 NOTE — Assessment & Plan Note (Addendum)
 Chronic, worsening control  Crestor   5 mg once weekly, unable to tolerate higher dose.  Discussed risk stratification for cardiovascular disease.  He will move forward with coronary artery calcium  CT score.

## 2023-07-21 NOTE — Progress Notes (Signed)
 Patient ID: Aaron Tyler, male    DOB: Apr 07, 1975, 48 y.o.   MRN: [id number]  This visit was conducted in person.  BP 110/66   Pulse 96   Temp 98.2 F (36.8 C) (Temporal)   Ht 6' 1.25 (1.861 m)   Wt 279 lb 6 oz (126.7 kg)   SpO2 98%   BMI 36.61 kg/m    CC:  Chief Complaint  Patient presents with   Annual Exam    Subjective:   HPI: Aaron Tyler is a 48 y.o. male presenting on 07/21/2023 for Annual Exam  The patient presents for  complete physical and review of chronic health problems. He/She also has the following acute concerns today: Doing well overall.  Replacing testosterone  followed by  PA K[facility]. [facility]   Has started on compounded tirzepatide, compunded.. has noted decrease appetite.  No SE except mild nausea.  Body mass index is 36.61 kg/m.  He has been working out, lots of stress,  moderate diet. Wt Readings from Last 3 Encounters:  07/21/23 279 lb 6 oz (126.7 kg)  06/22/23 275 lb (124.7 kg)  10/05/22 (!) 309 lb 9.6 oz (140.4 kg)    He continues with  having issues with varicose veins. Increase in size and soreness in last year.  Occ wearing compression hose.  Concerned with snoring, daytime fatigue.  May have worsened with weight gain.   GAD, stable control  Hypertension:    Inadequate control since weight gain.. has been out of  losartan  25 mg daily in last 3 days BP Readings from Last 3 Encounters:  07/21/23 110/66  06/22/23 125/81  10/05/22 132/80  Using medication without problems or lightheadedness:  none Chest pain with exertion: none Edema:none Short of breath: none Average home BPs: 120/70 Other issues:  Moderate persistent asthma  Elevated Cholesterol :   on rousvastatin once, Dad with MI  died early age 71  Lab Results  Component Value Date   CHOL 206 (H) 07/13/2023   HDL 33.00 (L) 07/13/2023   LDLCALC 140 (H) 07/13/2023   LDLDIRECT 132.0 10/25/2018   TRIG 164.0 (H) 07/13/2023   CHOLHDL 6 07/13/2023  Using  medications without problems: Muscle aches:  Diet compliance: Exercise: Other complaints:  The 10-year ASCVD risk score (Arnett DK, et al., 2019) is: 3.5%   Values used to calculate the score:     Age: 61 years     Clincally relevant sex: Male     Is Non-Hispanic African American: No     Diabetic: No     Tobacco smoker: No     Systolic Blood Pressure: 110 mmHg     Is BP treated: Yes     HDL Cholesterol: 33 mg/dL     Total Cholesterol: 206 mg/dL    Relevant past medical, surgical, family and social history reviewed and updated as indicated. Interim medical history since our last visit reviewed. Allergies and medications reviewed and updated. Outpatient Medications Prior to Visit  Medication Sig Dispense Refill   albuterol  (VENTOLIN  HFA) 108 (90 Base) MCG/ACT inhaler INHALE 2 PUFFS INTO THE LUNGS EVERY 6 HOURS AS NEEDED FOR WHEEZING 8.5 g 2   fluticasone  (FLONASE) 50 MCG/ACT nasal spray Place 1 spray into both nostrils daily as needed for allergies or rhinitis.     losartan  (COZAAR ) 25 MG tablet TAKE 1 TABLET(25 MG) BY MOUTH DAILY 90 tablet 0   meloxicam  (MOBIC ) 15 MG tablet Take 15 mg by mouth daily as needed for pain.  rosuvastatin  (CRESTOR ) 5 MG tablet TAKE 1 TABLET(5 MG) BY MOUTH 1 TIME A WEEK 12 tablet 0   ADVAIR  DISKUS 100-50 MCG/ACT AEPB Inhale 1 puff into the lungs 2 (two) times daily. 60 each 5   VALIUM  5 MG tablet Take 1 tablet (5 mg total) by mouth at bedtime as needed for muscle spasms. 15 tablet 0   meloxicam  (MOBIC ) 15 MG tablet TAKE 1 TABLET(15 MG) BY MOUTH DAILY 30 tablet 0   mometasone (NASONEX) 50 MCG/ACT nasal spray Place 2 sprays into the nose daily.     No facility-administered medications prior to visit.     Per HPI unless specifically indicated in ROS section below Review of Systems  Constitutional:  Negative for fatigue and fever.  HENT:  Negative for ear pain.   Eyes:  Negative for pain.  Respiratory:  Negative for cough and shortness of breath.    Cardiovascular:  Negative for chest pain, palpitations and leg swelling.  Gastrointestinal:  Negative for abdominal pain.  Genitourinary:  Negative for dysuria.  Musculoskeletal:  Negative for arthralgias.  Neurological:  Negative for syncope, light-headedness and headaches.  Psychiatric/Behavioral:  Negative for dysphoric mood.    Objective:  BP 110/66   Pulse 96   Temp 98.2 F (36.8 C) (Temporal)   Ht 6' 1.25 (1.861 m)   Wt 279 lb 6 oz (126.7 kg)   SpO2 98%   BMI 36.61 kg/m   Wt Readings from Last 3 Encounters:  07/21/23 279 lb 6 oz (126.7 kg)  06/22/23 275 lb (124.7 kg)  10/05/22 (!) 309 lb 9.6 oz (140.4 kg)      Physical Exam Constitutional:      General: He is not in acute distress.    Appearance: Normal appearance. He is well-developed. He is obese. He is not ill-appearing or toxic-appearing.  HENT:     Head: Normocephalic and atraumatic.     Right Ear: Hearing, tympanic membrane, ear canal and external ear normal.     Left Ear: Hearing, tympanic membrane, ear canal and external ear normal.     Nose: Nose normal.     Mouth/Throat:     Pharynx: Uvula midline.   Eyes:     General: Lids are normal. Lids are everted, no foreign bodies appreciated.     Conjunctiva/sclera: Conjunctivae normal.     Pupils: Pupils are equal, round, and reactive to light.   Neck:     Thyroid : No thyroid  mass or thyromegaly.     Vascular: No carotid bruit.     Trachea: Trachea and phonation normal.   Cardiovascular:     Rate and Rhythm: Normal rate and regular rhythm.     Pulses: Normal pulses.     Heart sounds: S1 normal and S2 normal. Heart sounds not distant. No murmur heard.    No friction rub. No gallop.     Comments:  Bilateral severe varicosities Pulmonary:     Effort: Pulmonary effort is normal. No respiratory distress.     Breath sounds: Normal breath sounds. No wheezing, rhonchi or rales.  Abdominal:     General: Bowel sounds are normal.     Palpations: Abdomen is  soft.     Tenderness: There is no abdominal tenderness. There is no guarding or rebound.     Hernia: No hernia is present.   Musculoskeletal:     Cervical back: Normal range of motion and neck supple.     Right lower leg: 1+ Edema present.  Lymphadenopathy:  Cervical: No cervical adenopathy.   Skin:    General: Skin is warm and dry.     Findings: No rash.   Neurological:     Mental Status: He is alert.     Cranial Nerves: No cranial nerve deficit.     Sensory: No sensory deficit.     Gait: Gait normal.     Deep Tendon Reflexes: Reflexes are normal and symmetric.   Psychiatric:        Speech: Speech normal.        Behavior: Behavior normal.        Thought Content: Thought content normal.        Judgment: Judgment normal.       Results for orders placed or performed in visit on 07/13/23  T3, free   Collection Time: 07/13/23  7:56 AM  Result Value Ref Range   T3, Free 3.7 2.3 - 4.2 pg/mL  T4, free   Collection Time: 07/13/23  7:56 AM  Result Value Ref Range   Free T4 0.95 0.60 - 1.60 ng/dL  TSH   Collection Time: 07/13/23  7:56 AM  Result Value Ref Range   TSH 2.38 0.35 - 5.50 uIU/mL  Hemoglobin A1c   Collection Time: 07/13/23  7:56 AM  Result Value Ref Range   Hgb A1c MFr Bld 6.0 4.6 - 6.5 %  Comprehensive metabolic panel   Collection Time: 07/13/23  7:56 AM  Result Value Ref Range   Sodium 138 135 - 145 mEq/L   Potassium 4.4 3.5 - 5.1 mEq/L   Chloride 102 96 - 112 mEq/L   CO2 31 19 - 32 mEq/L   Glucose, Bld 84 70 - 99 mg/dL   BUN 14 6 - 23 mg/dL   Creatinine, Ser 8.96 0.40 - 1.50 mg/dL   Total Bilirubin 1.2 0.2 - 1.2 mg/dL   Alkaline Phosphatase 40 39 - 117 U/L   AST 16 0 - 37 U/L   ALT 20 0 - 53 U/L   Total Protein 7.3 6.0 - 8.3 g/dL   Albumin 4.6 3.5 - 5.2 g/dL   GFR 13.00 >39.99 mL/min   Calcium  9.2 8.4 - 10.5 mg/dL  Lipid panel   Collection Time: 07/13/23  7:56 AM  Result Value Ref Range   Cholesterol 206 (H) 0 - 200 mg/dL   Triglycerides  835.9 (H) 0.0 - 149.0 mg/dL   HDL 66.99 (L) >60.99 mg/dL   VLDL 67.1 0.0 - 59.9 mg/dL   LDL Cholesterol 859 (H) 0 - 99 mg/dL   Total CHOL/HDL Ratio 6    NonHDL 172.65   Testosterone  Total,Free,Bio, Males   Collection Time: 07/13/23  7:56 AM  Result Value Ref Range   Testosterone  2,481 (H) 250 - 827 ng/dL   Albumin 4.5 3.6 - 5.1 g/dL   Sex Hormone Binding 13 10 - 50 nmol/L   Testosterone , Free 928.2 (H) 46.0 - 224.0 pg/mL   Testosterone , Bioavailable 1,908.9 (H) 110.0 - 575.0 ng/dL     COVID 19 screen:  No recent travel or known exposure to COVID19 The patient denies respiratory symptoms of COVID 19 at this time. The importance of social distancing was discussed today.   Assessment and Plan   The patient's preventative maintenance and recommended screening tests for an annual wellness exam were reviewed in full today. Brought up to date unless services declined.  Counselled on the importance of diet, exercise, and its role in overall health and mortality. The patient's FH and SH was reviewed, including  their home life, tobacco status, and drug and alcohol status.   Vaccines:  COVID x 2, Tdap due, PNA uptodate, flu update Prostate Cancer Screen: no early family hx Colon Cancer Screen due      Smoking Status: none  HIV screen:   refused  ETOH: occ  no drug use.  Problem List Items Addressed This Visit     Essential hypertension, benign   Chronic, losartan  25 mg daily.  Encouraged exercise, weight loss, healthy eating habits.       Hypercholesteremia   Chronic, worsening control  Crestor   5 mg once weekly, unable to tolerate higher dose.  Discussed risk stratification for cardiovascular disease.  He will move forward with coronary artery calcium  CT score.      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      Moderate persistent asthma   Chronic, stable control        Other  Visit Diagnoses       Routine general medical examination at a health care facility    -  Primary     Family history of early CAD       Relevant Orders   CT CARDIAC SCORING (SELF PAY ONLY)     Colon cancer screening       Relevant Orders   Ambulatory referral to Gastroenterology       No orders of the defined types were placed in this encounter.  Orders Placed This Encounter  Procedures   CT CARDIAC SCORING (SELF PAY ONLY)    Standing Status:   Future    Expiration Date:   07/20/2024    Preferred imaging location?:   [facility]   Ambulatory referral to Gastroenterology    Referral Priority:   Routine    Referral Type:   Consultation    Referral Reason:   Specialty Services Required    Number of Visits Requested:   1    Greig Ring, MD

## 2023-07-26 MED ORDER — VALIUM 5 MG PO TABS: 5.0000 mg | ORAL_TABLET | Freq: Every evening | ORAL | 0 refills | Status: DC | PRN

## 2023-07-31 ENCOUNTER — Other Ambulatory Visit: Payer: Self-pay | Admitting: Family Medicine

## 2023-08-15 NOTE — Assessment & Plan Note (Signed)
 Chronic, currently replaced by urology.

## 2023-08-15 NOTE — Assessment & Plan Note (Signed)
 Chronic, losartan  25 mg daily.  Encouraged exercise, weight loss, healthy eating habits.

## 2023-08-31 ENCOUNTER — Encounter: Payer: Self-pay | Admitting: Family Medicine

## 2023-09-01 MED ORDER — VALIUM 5 MG PO TABS: 5.0000 mg | ORAL_TABLET | Freq: Every evening | ORAL | 0 refills | Status: DC | PRN

## 2023-09-01 NOTE — Telephone Encounter (Signed)
 Last office visit 07/21/2023 for CPE.  Last refilled 07/26/23 for #15 with no refills.  Next Appt: No future appointments.

## 2023-09-18 ENCOUNTER — Encounter: Payer: Self-pay | Admitting: Pediatrics

## 2023-09-18 ENCOUNTER — Ambulatory Visit (AMBULATORY_SURGERY_CENTER)

## 2023-09-18 DIAGNOSIS — Z1211 Encounter for screening for malignant neoplasm of colon: Secondary | ICD-10-CM

## 2023-09-18 MED ORDER — NA SULFATE-K SULFATE-MG SULF 17.5-3.13-1.6 GM/177ML PO SOLN: 1.0000 | Freq: Once | ORAL | 0 refills | Status: AC

## 2023-09-18 NOTE — Progress Notes (Signed)

## 2023-09-19 ENCOUNTER — Other Ambulatory Visit: Payer: Self-pay | Admitting: Family Medicine

## 2023-09-21 ENCOUNTER — Other Ambulatory Visit: Payer: Self-pay | Admitting: Family Medicine

## 2023-09-21 MED ORDER — VALIUM 5 MG PO TABS: 5.0000 mg | ORAL_TABLET | Freq: Every evening | ORAL | 0 refills | Status: DC | PRN

## 2023-09-21 MED ORDER — ADVAIR DISKUS 100-50 MCG/ACT IN AEPB: 1.0000 | INHALATION_SPRAY | Freq: Two times a day (BID) | RESPIRATORY_TRACT | 5 refills | Status: AC

## 2023-09-21 NOTE — Telephone Encounter (Unsigned)
 Copied from CRM #[id number]. 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

## 2023-09-21 NOTE — Telephone Encounter (Signed)
 Refill had originally sent to [facility] on [address] and I instructed the [facility] on [address] to transfer Rx but apparently this has not been done so I went ahead and sent in refills to correct pharmacy.  Jacolby notified of this via telephone.

## 2023-09-27 ENCOUNTER — Encounter: Payer: Self-pay | Admitting: Pediatrics

## 2023-10-01 NOTE — Progress Notes (Unsigned)
 [facility] History and Physical   Primary Care Physician:  Avelina Greig BRAVO, MD   Reason for Procedure:  Colorectal cancer screening  Plan:    Colonoscopy     HPI: Aaron Tyler is a 48 y.o. male undergoing screening colonoscopy for colorectal cancer screening.  This is the patient's first colonoscopy.  No family history of colorectal cancer; mother with polyps.  Patient denies current symptoms of change in bowel habits or rectal bleeding.   Past Medical History:  Diagnosis Date   ADD (attention deficit disorder) without hyperactivity    History of tobacco abuse quit 2006   Hyperlipidemia    Pollen allergies    Shingles    Unspecified asthma(493.90)    Varicose veins LEG PAIN OVER CALF VARICOSITIES    HX OF SCLEROTHERAPY     Past Surgical History:  Procedure Laterality Date   ENDOVENOUS ABLATION SAPHENOUS VEIN W/ LASER  06-16-2011   right greater saphenous vein and stab phlebectomy >20 right leg   ENDOVENOUS ABLATION SAPHENOUS VEIN W/ LASER  09-15-2011   left greater saphenous vein and stab phlebectomies>20 Incisions left leg   ESOPHAGOGASTRODUODENOSCOPY     ?achalasia   surgical removal of splinter (foreign body) right foot  06-2011   right foot   VARICOSE VEIN SURGERY     sclerotherapy    Prior to Admission medications   Medication Sig Start Date End Date Taking? Authorizing Provider  ADVAIR  DISKUS 100-50 MCG/ACT AEPB Inhale 1 puff into the lungs 2 (two) times daily. 09/21/23   Bedsole, Amy E, MD  albuterol  (VENTOLIN  HFA) 108 (90 Base) MCG/ACT inhaler INHALE 2 PUFFS INTO THE LUNGS EVERY 6 HOURS AS NEEDED FOR WHEEZING 09/19/23   Bedsole, Amy E, MD  fluticasone  (FLONASE) 50 MCG/ACT nasal spray Place 1 spray into both nostrils daily as needed for allergies or rhinitis.    [provider]  losartan  (COZAAR ) 25 MG tablet TAKE 1 TABLET(25 MG) BY MOUTH DAILY 07/11/23   Bedsole, Amy E, MD  meloxicam  (MOBIC ) 15 MG tablet Take 15 mg by mouth daily as needed  for pain.    [provider]  rosuvastatin  (CRESTOR ) 5 MG tablet TAKE 1 TABLET(5 MG) BY MOUTH 1 TIME A WEEK Patient not taking: Reported on 09/18/2023 10/04/22   Avelina Greig BRAVO, MD  TESTOSTERONE  CYPIONATE IM Inject into the muscle once a week.    [provider]  TIRZEPATIDE-WEIGHT MANAGEMENT [place] Inject into the skin once a week.    [provider]  VALIUM  5 MG tablet Take 1 tablet (5 mg total) by mouth at bedtime as needed for muscle spasms. 09/21/23   Avelina Greig BRAVO, MD    Current Outpatient Medications  Medication Sig Dispense Refill   ADVAIR  DISKUS 100-50 MCG/ACT AEPB Inhale 1 puff into the lungs 2 (two) times daily. 60 each 5   albuterol  (VENTOLIN  HFA) 108 (90 Base) MCG/ACT inhaler INHALE 2 PUFFS INTO THE LUNGS EVERY 6 HOURS AS NEEDED FOR WHEEZING 8.5 g 2   losartan  (COZAAR ) 25 MG tablet TAKE 1 TABLET(25 MG) BY MOUTH DAILY 90 tablet 0   VALIUM  5 MG tablet Take 1 tablet (5 mg total) by mouth at bedtime as needed for muscle spasms. 30 tablet 0   fluticasone  (FLONASE) 50 MCG/ACT nasal spray Place 1 spray into both nostrils daily as needed for allergies or rhinitis.     meloxicam  (MOBIC ) 15 MG tablet Take 15 mg by mouth daily as needed for pain.     rosuvastatin  (CRESTOR )  5 MG tablet TAKE 1 TABLET(5 MG) BY MOUTH 1 TIME A WEEK (Patient not taking: Reported on 09/18/2023) 12 tablet 0   TESTOSTERONE  CYPIONATE IM Inject into the muscle once a week.     TIRZEPATIDE-WEIGHT MANAGEMENT [place] Inject into the skin once a week.     Current Facility-Administered Medications  Medication Dose Route Frequency Provider Last Rate Last Admin   0.9 %  sodium chloride  infusion  500 mL Intravenous Once Leya Paige, Inocente HERO, MD        Allergies as of 10/02/2023   (No Known Allergies)    Family History  Problem Relation Age of Onset   Heart disease Mother    Heart attack Father 60   Hypertension Father    Hyperlipidemia Brother    Cancer Brother        throat   Hyperlipidemia Brother     Hyperlipidemia Brother    Cancer Maternal Grandfather        lung   Colon cancer Neg Hx    Rectal cancer Neg Hx    Stomach cancer Neg Hx     Social History   Socioeconomic History   Marital status: Married    Spouse name: Not on file   Number of children: Not on file   Years of education: Not on file   Highest education level: Not on file  Occupational History   Occupation: Brewing technologist and Camera operator: [employer]  Tobacco Use   Smoking status: Former    Current packs/day: 0.00    Types: Cigarettes    Quit date: 02/21/2001    Years since quitting: 22.6   Smokeless tobacco: Never   Tobacco comments:    pt. smoked in 2003 for about 3 months on a social basis  Vaping Use   Vaping status: Never Used  Substance and Sexual Activity   Alcohol use: Yes    Alcohol/week: 6.0 standard drinks of alcohol    Types: 6 Cans of beer per week    Comment: every other weekend   Drug use: No   Sexual activity: Not on file  Other Topics Concern   Not on file  Social History Narrative   Regular exercise---yes, not regularly but sometimes 1-2 times per week      Diet--- chick salad, organics, + h2o, some fast food, occasionally skips breakfast         Social Drivers of Corporate investment banker Strain: Not on file  Food Insecurity: Not on file  Transportation Needs: Not on file  Physical Activity: Not on file  Stress: Not on file  Social Connections: Not on file  Intimate Partner Violence: Not on file    Review of Systems:  All other review of systems negative except as mentioned in the HPI.  Physical Exam: Vital signs BP (!) 163/117   Pulse 83   Temp 98.2 F (36.8 C)   Resp 14   Ht 6' 1 (1.854 m)   Wt 254 lb (115.2 kg)   SpO2 97%   BMI 33.51 kg/m   General:   Alert,  Well-developed, well-nourished, pleasant and cooperative in NAD Airway:  Mallampati 2 Lungs:  Clear throughout to auscultation.   Heart:  Regular rate and rhythm; no murmurs, clicks,  rubs,  or gallops. Abdomen:  Soft, nontender and nondistended. Normal bowel sounds.   Neuro/Psych:  Normal mood and affect. A and O x 3  Inocente Hausen, MD [facility]

## 2023-10-02 ENCOUNTER — Ambulatory Visit: Admitting: Pediatrics

## 2023-10-02 ENCOUNTER — Encounter: Payer: Self-pay | Admitting: Pediatrics

## 2023-10-02 VITALS — BP 133/102 | HR 84 | Temp 98.2°F | Resp 12 | Ht 73.0 in | Wt 254.0 lb

## 2023-10-02 DIAGNOSIS — Z83719 Family history of colon polyps, unspecified: Secondary | ICD-10-CM | POA: Diagnosis not present

## 2023-10-02 DIAGNOSIS — D124 Benign neoplasm of descending colon: Secondary | ICD-10-CM

## 2023-10-02 DIAGNOSIS — K635 Polyp of colon: Secondary | ICD-10-CM

## 2023-10-02 DIAGNOSIS — Z1211 Encounter for screening for malignant neoplasm of colon: Secondary | ICD-10-CM | POA: Diagnosis present

## 2023-10-02 DIAGNOSIS — K5730 Diverticulosis of large intestine without perforation or abscess without bleeding: Secondary | ICD-10-CM

## 2023-10-02 DIAGNOSIS — K648 Other hemorrhoids: Secondary | ICD-10-CM

## 2023-10-02 MED ORDER — SODIUM CHLORIDE 0.9 % IV SOLN: 500.0000 mL | Freq: Once | INTRAVENOUS | Status: AC

## 2023-10-02 NOTE — Op Note (Signed)
 [facility] Patient Name: Aaron Tyler Procedure Date: 10/02/2023 11:29 AM MRN: [id number] Endoscopist: Inocente Hausen , MD, [id number] Age: 48 Referring MD:  Date of Birth: 1975-12-22 Gender: Male Account #: [id number] Procedure:                Colonoscopy Indications:              Colon cancer screening in patient at increased                            risk: Family history of 1st-degree relative with                            colon polyps at age 53 years (or older), This is                            the patient's first colonoscopy Medicines:                Monitored Anesthesia Care Procedure:                Pre-Anesthesia Assessment:                           - Prior to the procedure, a History and Physical                            was performed, and patient medications and                            allergies were reviewed. The patient's tolerance of                            previous anesthesia was also reviewed. The risks                            and benefits of the procedure and the sedation                            options and risks were discussed with the patient.                            All questions were answered, and informed consent                            was obtained. Prior Anticoagulants: The patient has                            taken no anticoagulant or antiplatelet agents. ASA                            Grade Assessment: II - A patient with mild systemic                            disease. After reviewing the risks and benefits,  the patient was deemed in satisfactory condition to                            undergo the procedure.                           After obtaining informed consent, the colonoscope                            was passed under direct vision. Throughout the                            procedure, the patient's blood pressure, pulse, and                            oxygen saturations were monitored  continuously. The                            Olympus Scope [id number] was introduced through the                            anus and advanced to the cecum, identified by                            appendiceal orifice and ileocecal valve. The                            colonoscopy was performed without difficulty. The                            patient tolerated the procedure well. The quality                            of the bowel preparation was good. The terminal                            ileum, ileocecal valve, appendiceal orifice, and                            rectum were photographed. Scope In: 11:39:19 AM Scope Out: 11:59:06 AM Scope Withdrawal Time: 0 hours 17 minutes 2 seconds  Total Procedure Duration: 0 hours 19 minutes 47 seconds  Findings:                 The perianal and digital rectal examinations were                            normal. Pertinent negatives include normal                            sphincter tone and no palpable rectal lesions.                           Multiple small-mouthed diverticula were found in  the sigmoid colon.                           A 3 mm polyp was found in the descending colon. The                            polyp was sessile. The polyp was removed with a                            cold biopsy forceps. Resection and retrieval were                            complete.                           A 6 mm polyp was found in the descending colon. The                            polyp was sessile. The polyp was removed with a                            cold snare. Resection and retrieval were complete.                           A 9 mm polyp was found in the descending colon. The                            polyp was sessile. The polyp was removed with a hot                            snare. Resection and retrieval were complete.                           Internal hemorrhoids were found during retroflexion. Complications:             No immediate complications. Estimated blood loss:                            Minimal. Estimated Blood Loss:     Estimated blood loss was minimal. Impression:               - Diverticulosis in the sigmoid colon.                           - One 3 mm polyp in the descending colon, removed                            with a cold biopsy forceps. Resected and retrieved.                           - One 6 mm polyp in the descending colon, removed                            with a  cold snare. Resected and retrieved.                           - One 9 mm polyp in the descending colon, removed                            with a hot snare. Resected and retrieved.                           - Internal hemorrhoids. Recommendation:           - Discharge patient to home (ambulatory).                           - Await pathology results.                           - Repeat colonoscopy for surveillance based on                            pathology results.                           - The findings and recommendations were discussed                            with the patient's family.                           - Return to referring physician.                           - Patient has a contact number available for                            emergencies. The signs and symptoms of potential                            delayed complications were discussed with the                            patient. Return to normal activities tomorrow.                            Written discharge instructions were provided to the                            patient. Inocente Hausen, MD 10/02/2023 12:05:37 PM This report has been signed electronically.

## 2023-10-02 NOTE — Progress Notes (Signed)
 Called to room to assist during endoscopic procedure.  Patient ID and intended procedure confirmed with present staff. Received instructions for my participation in the procedure from the performing physician.

## 2023-10-02 NOTE — Patient Instructions (Signed)
 YOU HAD AN ENDOSCOPIC PROCEDURE TODAY AT THE [facility]:   Refer to the procedure report that was given to you for any specific questions about what was found during the examination.  If the procedure report does not answer your questions, please call your gastroenterologist to clarify.  If you requested that your care partner not be given the details of your procedure findings, then the procedure report has been included in a sealed envelope for you to review at your convenience later.  YOU SHOULD EXPECT: Some feelings of bloating in the abdomen. Passage of more gas than usual.  Walking can help get rid of the air that was put into your GI tract during the procedure and reduce the bloating. If you had a lower endoscopy (such as a colonoscopy or flexible sigmoidoscopy) you may notice spotting of blood in your stool or on the toilet paper. If you underwent a bowel prep for your procedure, you may not have a normal bowel movement for a few days.  Please Note:  You might notice some irritation and congestion in your nose or some drainage.  This is from the oxygen used during your procedure.  There is no need for concern and it should clear up in a day or so.  SYMPTOMS TO REPORT IMMEDIATELY:  Following lower endoscopy (colonoscopy or flexible sigmoidoscopy):  Excessive amounts of blood in the stool  Significant tenderness or worsening of abdominal pains  Swelling of the abdomen that is new, acute  Fever of 100F or higher  Resume previous diet Await pathology results Return to referring physicain  For urgent or emergent issues, a gastroenterologist can be reached at any hour by calling [phone]. Do not use MyChart messaging for urgent concerns.    DIET:  We do recommend a small meal at first, but then you may proceed to your regular diet.  Drink plenty of fluids but you should avoid alcoholic beverages for 24 hours.  ACTIVITY:  You should plan to take it easy for the rest of  today and you should NOT DRIVE or use heavy machinery until tomorrow (because of the sedation medicines used during the test).    FOLLOW UP: Our staff will call the number listed on your records the next business day following your procedure.  We will call around 7:15- 8:00 am to check on you and address any questions or concerns that you may have regarding the information given to you following your procedure. If we do not reach you, we will leave a message.     If any biopsies were taken you will be contacted by phone or by letter within the next 1-3 weeks.  Please call us  at [phone] if you have not heard about the biopsies in 3 weeks.    SIGNATURES/CONFIDENTIALITY: You and/or your care partner have signed paperwork which will be entered into your electronic medical record.  These signatures attest to the fact that that the information above on your After Visit Summary has been reviewed and is understood.  Full responsibility of the confidentiality of this discharge information lies with you and/or your care-partner.

## 2023-10-02 NOTE — Progress Notes (Signed)
 1159 BP 152/107, Labetalol given IV, MD update, vss

## 2023-10-02 NOTE — Progress Notes (Signed)
 Pt's states no medical or surgical changes since previsit or office visit.

## 2023-10-02 NOTE — Progress Notes (Signed)
 Report given to PACU, vss

## 2023-10-03 ENCOUNTER — Telehealth: Payer: Self-pay | Admitting: *Deleted

## 2023-10-03 NOTE — Telephone Encounter (Signed)
Attempted to call patient for their post-procedure follow-up call. No answer. Left voicemail.   

## 2023-10-04 ENCOUNTER — Ambulatory Visit: Payer: Self-pay | Admitting: Pediatrics

## 2023-10-04 LAB — SURGICAL PATHOLOGY

## 2023-10-27 ENCOUNTER — Encounter: Payer: Self-pay | Admitting: Family Medicine

## 2023-11-02 ENCOUNTER — Telehealth: Admitting: Family Medicine

## 2023-11-02 VITALS — BP 130/77 | Wt 239.7 lb

## 2023-11-02 DIAGNOSIS — F411 Generalized anxiety disorder: Secondary | ICD-10-CM | POA: Diagnosis not present

## 2023-11-02 MED ORDER — DIAZEPAM 5 MG PO TABS: 2.5000 mg | ORAL_TABLET | Freq: Every evening | ORAL | 1 refills | Status: AC | PRN

## 2023-11-02 NOTE — Progress Notes (Signed)
 VIRTUAL VISIT A virtual visit is felt to be most appropriate for this patient at this time.   I connected with the patient on 11/02/23 at 12:00 PM EDT by virtual telehealth platform and verified that I am speaking with the correct person using two identifiers.   I discussed the limitations, risks, security and privacy concerns of performing an evaluation and management service by  virtual telehealth platform and the availability of in person appointments. I also discussed with the patient that there may be a patient responsible charge related to this service. The patient expressed understanding and agreed to proceed.  Patient location: Home Provider Location: [place] Participants: Greig Ring and Norleen JONELLE Paula   Chief Complaint  Patient presents with   Anxiety    Patient declined to answer any questions for me.     History of Present Illness:  48 y.o. male patient of Deicy Rusk E, MD presents  for follow up GAD.  He has been working on weight loss, working out 4-5 days a week.  In last 11 months he has lost 60 lbs.  Using tirzepatide 10 mg for weight loss, mild nausea.  He feels like valium   2.5  mg later in day , to help calm.  He is feeling calmer overall.  Anxiety is related to overwhelm and stress at work.   BP Readings from Last 3 Encounters:  11/02/23 130/77  10/02/23 (!) 133/102  07/21/23 110/66   Wt Readings from Last 3 Encounters:  11/02/23 239 lb 11.2 oz (108.7 kg)  10/02/23 254 lb (115.2 kg)  07/21/23 279 lb 6 oz (126.7 kg)        11/02/2023   12:47 PM 06/22/2023    3:20 PM 05/17/2022    9:07 AM  PHQ9 SCORE ONLY  PHQ-9 Total Score 1 0  1      Data saved with a previous flowsheet row definition       11/02/2023    1:04 PM  GAD 7 : Generalized Anxiety Score  Nervous, Anxious, on Edge 1  Control/stop worrying 1  Worry too much - different things 1  Trouble relaxing 1  Restless 0  Easily annoyed or irritable 1  Afraid - awful might happen  0  Total GAD 7 Score 5  Anxiety Difficulty Somewhat difficult        COVID 19 screen No recent travel or known exposure to COVID19 The patient denies respiratory symptoms of COVID 19 at this time.  The importance of social distancing was discussed today.   Review of Systems  Constitutional:  Negative for chills and fever.  HENT:  Negative for congestion and ear pain.   Eyes:  Negative for pain and redness.  Respiratory:  Negative for cough and shortness of breath.   Cardiovascular:  Negative for chest pain, palpitations and leg swelling.  Gastrointestinal:  Negative for abdominal pain, blood in stool, constipation, diarrhea, nausea and vomiting.  Genitourinary:  Negative for dysuria.  Musculoskeletal:  Negative for falls and myalgias.  Skin:  Negative for rash.  Neurological:  Negative for dizziness.  Psychiatric/Behavioral:  Negative for depression. The patient is nervous/anxious.       Past Medical History:  Diagnosis Date   ADD (attention deficit disorder) without hyperactivity    History of tobacco abuse quit 2006   Hyperlipidemia    Pollen allergies    Shingles    Unspecified asthma(493.90)    Varicose veins LEG PAIN OVER CALF VARICOSITIES    HX OF  SCLEROTHERAPY     reports that he quit smoking about 22 years ago. His smoking use included cigarettes. He has never used smokeless tobacco. He reports current alcohol use of about 6.0 standard drinks of alcohol per week. He reports that he does not use drugs.   Current Outpatient Medications:    ADVAIR  DISKUS 100-50 MCG/ACT AEPB, Inhale 1 puff into the lungs 2 (two) times daily., Disp: 60 each, Rfl: 5   albuterol  (VENTOLIN  HFA) 108 (90 Base) MCG/ACT inhaler, INHALE 2 PUFFS INTO THE LUNGS EVERY 6 HOURS AS NEEDED FOR WHEEZING, Disp: 8.5 g, Rfl: 2   fluticasone  (FLONASE) 50 MCG/ACT nasal spray, Place 1 spray into both nostrils daily as needed for allergies or rhinitis., Disp: , Rfl:    losartan  (COZAAR ) 25 MG tablet, TAKE 1  TABLET(25 MG) BY MOUTH DAILY, Disp: 90 tablet, Rfl: 0   meloxicam  (MOBIC ) 15 MG tablet, Take 15 mg by mouth daily as needed for pain., Disp: , Rfl:    rosuvastatin  (CRESTOR ) 5 MG tablet, TAKE 1 TABLET(5 MG) BY MOUTH 1 TIME A WEEK (Patient not taking: Reported on 09/18/2023), Disp: 12 tablet, Rfl: 0   TESTOSTERONE  CYPIONATE IM, Inject into the muscle once a week., Disp: , Rfl:    TIRZEPATIDE-WEIGHT MANAGEMENT [place], Inject into the skin once a week., Disp: , Rfl:    VALIUM  5 MG tablet, Take 1 tablet (5 mg total) by mouth at bedtime as needed for muscle spasms., Disp: 30 tablet, Rfl: 0  Current Facility-Administered Medications:    0.9 %  sodium chloride  infusion, 500 mL, Intravenous, Once, McGreal, Inocente HERO, MD   Observations/Objective: Blood pressure 130/77, weight 239 lb 11.2 oz (108.7 kg).  Physical Exam Constitutional:      General: The patient is not in acute distress. Pulmonary:     Effort: Pulmonary effort is normal. No respiratory distress.  Neurological:     Mental Status: The patient is alert and oriented to person, place, and time.  Psychiatric:        Mood and Affect: Mood normal.        Behavior: Behavior normal.    Assessment and Plan There are no diagnoses linked to this encounter.    I discussed the assessment and treatment plan with the patient. The patient was provided an opportunity to ask questions and all were answered. The patient agreed with the plan and demonstrated an understanding of the instructions.   The patient was advised to call back or seek an in-person evaluation if the symptoms worsen or if the condition fails to improve as anticipated.     Greig Ring, MD

## 2023-11-23 NOTE — Assessment & Plan Note (Signed)
 Chronic, worsening control with increase in overwhelm and stress at work. He feels that the Valium  he had previously been using for alternate symptoms has helped significantly with helping him calm down later in the day.  We discussed associated risks and side effects with benzodiazepines. He is not interested in SSRI or referral to counseling. I encouraged him to use this in a limited fashion and work on stress reduction and relaxation techniques.  We discussed management of stress.

## 2023-12-05 ENCOUNTER — Encounter: Payer: Self-pay | Admitting: Family Medicine

## 2023-12-15 ENCOUNTER — Telehealth: Payer: Self-pay | Admitting: Primary Care

## 2023-12-15 NOTE — Telephone Encounter (Signed)
 Patient was seen in Aug 2024, unfortunately HST was not ordered by mistake. Do I need to see him to re-order or can I place order with you or snap and have him come back after sleep study to discuss results?

## 2023-12-15 NOTE — Telephone Encounter (Signed)
 I spoke with Aaron Tyler at [facility] and she stated that pt would need a recent OV for the sleep study

## 2023-12-15 NOTE — Telephone Encounter (Signed)
 Patient needs to keep apt

## 2023-12-18 ENCOUNTER — Ambulatory Visit (INDEPENDENT_AMBULATORY_CARE_PROVIDER_SITE_OTHER): Admitting: Primary Care

## 2023-12-18 ENCOUNTER — Encounter: Payer: Self-pay | Admitting: Primary Care

## 2023-12-18 VITALS — BP 126/92 | HR 81 | Temp 97.5°F | Ht 75.0 in | Wt 237.0 lb

## 2023-12-18 DIAGNOSIS — Z6836 Body mass index (BMI) 36.0-36.9, adult: Secondary | ICD-10-CM

## 2023-12-18 DIAGNOSIS — E66812 Obesity, class 2: Secondary | ICD-10-CM | POA: Diagnosis not present

## 2023-12-18 DIAGNOSIS — R0683 Snoring: Secondary | ICD-10-CM | POA: Diagnosis not present

## 2023-12-18 DIAGNOSIS — E6609 Other obesity due to excess calories: Secondary | ICD-10-CM

## 2023-12-18 DIAGNOSIS — J4540 Moderate persistent asthma, uncomplicated: Secondary | ICD-10-CM

## 2023-12-18 NOTE — Patient Instructions (Addendum)
   There are different severities of sleep apnea: - Mild sleep apnea 5-15 apneas per hour  - Moderate sleep apnea 5-30 events per hour  - Severe sleep apnea > 30   Treatment options depend on severity include weight loss, oral appliance, CPAP or possible surgical interventions   Orders: Home sleep study re: snoring through [facility]   Follow-up Please send message on mychart 2-3 weeks after completing sleep study for results and next step if needed

## 2023-12-18 NOTE — Progress Notes (Signed)
 @Patient  ID: Aaron Tyler, male    DOB: 14-Oct-1975, 48 y.o.   MRN: [id number]  No chief complaint on file.   Referring provider: Avelina Greig BRAVO, MD  HPI: 48 year old male, former smoker quit in 2003.  Past medical history significant for hypertension, asthma, allergic rhinitis, pulmonary nodule, anxiety, obesity.   Previous [facility] pulmonary encounter:  10/05/2022 Patient presents today for sleep consult due to witnessed apnea, self referred. He has symptoms of restless and nonrestorative sleep.  He is under quite a bit of stress at work.  He runs a large business, works in automotive engineer.  He starts his day between 4 and 5 AM.  He is trying to go to more of a domestic schedule.  He used to work evenings/nights in [employer].  Typical bedtime is 10 PM. He can fall asleep pretty much any time during the day. It takes him less than 30 seconds to fall asleep. He still dreams a lot. He wakes up around 1 AM. Eats at night. Falls back to sleep 2-3:30am. Starts his day at 5am. He works from home. Hx drug use in the past/ amphetamines. Starting to make some life-style changes . Seeing nutritionist. No previous sleep study. No overt sign for narcolepsy, cataplexy or sleepwalking.  Sleep questionnaire Symptoms- restless sleep, non-restorative sleep, snores, woken up short of breath  Prior sleep study- None  Bedtime- 10pm  Time to fall asleep- 30 sec Nocturnal awakenings- 4-5 times  Out of bed/start of day- 5am Weight changes- 50 lbs  Do you operate heavy machinery- No  Do you currently wear CPAP- No Do you current wear oxygen- No [place]- 12   12/18/2023- Interim hx  Discussed the use of AI scribe software for clinical note transcription with the patient, who gave verbal consent to proceed.  History of Present Illness Aaron Tyler is a 48 year old male who presents with symptoms of sleep apnea.  He reports symptoms including snoring and disrupted sleep patterns. His sleep schedule is  irregular due to work commitments, with sleep occurring from approximately 8:30 PM to 1:30 AM. He has undergone significant weight loss and reports experiencing more REM sleep and pleasant dreams since losing weight. No night terrors or sleep paralysis, although he describes a learned ability to control his dreams and occasionally experiences a deep paralysis during sleep that requires effort to overcome.  He has lost close to 80 lbs since last year. He is currently on tirzepatide for weight management, using a compounded form.  He reports no side effects such as nausea, vomiting, diarrhea, or constipation. He is not diabetic and has no history of thyroid  cancer.  Regarding his asthma, he has a diagnosis of moderate persistent asthma and uses Advair  100mcg one puff q12 hours as needed, primarily in response to environmental triggers such as animal dander. He uses Advair  about once a week and has not needed albuterol  in the past month. He has a history of hospitalization for asthma as a child, primarily due to undiagnosed allergies to dogs. He no longer has dogs and reports infrequent need for asthma medication.   No Known Allergies  Immunization History  Administered Date(s) Administered   Influenza Whole 12/22/2005   MMR 11/25/2010   PFIZER(Purple Top)SARS-COV-2 Vaccination 06/28/2019, 07/30/2019   Pneumococcal Polysaccharide-23 07/29/2009   Td 02/22/2003   Tdap 01/22/2016    Past Medical History:  Diagnosis Date   ADD (attention deficit disorder) without hyperactivity    History of tobacco abuse  quit 2006   Hyperlipidemia    Pollen allergies    Shingles    Unspecified asthma(493.90)    Varicose veins LEG PAIN OVER CALF VARICOSITIES    HX OF SCLEROTHERAPY     Tobacco History: Social History   Tobacco Use  Smoking Status Former   Current packs/day: 0.00   Types: Cigarettes   Quit date: 02/21/2001   Years since quitting: 22.8  Smokeless Tobacco Never  Tobacco Comments   pt.  smoked in 2003 for about 3 months on a social basis   Counseling given: Not Answered Tobacco comments: pt. smoked in 2003 for about 3 months on a social basis   Outpatient Medications Prior to Visit  Medication Sig Dispense Refill   ADVAIR  DISKUS 100-50 MCG/ACT AEPB Inhale 1 puff into the lungs 2 (two) times daily. 60 each 5   albuterol  (VENTOLIN  HFA) 108 (90 Base) MCG/ACT inhaler INHALE 2 PUFFS INTO THE LUNGS EVERY 6 HOURS AS NEEDED FOR WHEEZING 8.5 g 2   diazepam  (VALIUM ) 5 MG tablet Take 0.5 tablets (2.5 mg total) by mouth at bedtime as needed for anxiety. 30 tablet 1   fluticasone  (FLONASE) 50 MCG/ACT nasal spray Place 1 spray into both nostrils daily as needed for allergies or rhinitis.     losartan  (COZAAR ) 25 MG tablet TAKE 1 TABLET(25 MG) BY MOUTH DAILY 90 tablet 0   meloxicam  (MOBIC ) 15 MG tablet Take 15 mg by mouth daily as needed for pain.     rosuvastatin  (CRESTOR ) 5 MG tablet TAKE 1 TABLET(5 MG) BY MOUTH 1 TIME A WEEK (Patient not taking: Reported on 09/18/2023) 12 tablet 0   TESTOSTERONE  CYPIONATE IM Inject into the muscle once a week.     TIRZEPATIDE-WEIGHT MANAGEMENT [place] Inject into the skin once a week.     Facility-Administered Medications Prior to Visit  Medication Dose Route Frequency Provider Last Rate Last Admin   0.9 %  sodium chloride  infusion  500 mL Intravenous Once McGreal, Inocente HERO, MD       Review of Systems  Review of Systems  Constitutional: Negative.   Respiratory: Negative.  Negative for cough, shortness of breath and wheezing.   Psychiatric/Behavioral:  Positive for sleep disturbance.    Physical Exam  There were no vitals taken for this visit. Physical Exam Constitutional:      Appearance: Normal appearance. He is well-developed.  HENT:     Head: Normocephalic and atraumatic.     Mouth/Throat:     Mouth: Mucous membranes are moist.     Pharynx: Oropharynx is clear.  Cardiovascular:     Rate and Rhythm: Normal rate and regular rhythm.     Heart  sounds: Normal heart sounds.  Pulmonary:     Effort: Pulmonary effort is normal. No respiratory distress.     Breath sounds: Normal breath sounds. No wheezing or rhonchi.  Musculoskeletal:        General: Normal range of motion.     Cervical back: Normal range of motion and neck supple.  Skin:    General: Skin is warm and dry.     Findings: No erythema or rash.  Neurological:     General: No focal deficit present.     Mental Status: He is alert and oriented to person, place, and time. Mental status is at baseline.  Psychiatric:        Mood and Affect: Mood normal.        Behavior: Behavior normal.  Thought Content: Thought content normal.        Judgment: Judgment normal.      Lab Results:  CBC    Component Value Date/Time   WBC 7.2 01/04/2019 1625   RBC 4.93 01/04/2019 1625   HGB 15.7 01/04/2019 1625   HCT 43.5 01/04/2019 1625   PLT 299 01/04/2019 1625   MCV 88.2 01/04/2019 1625   MCH 31.8 01/04/2019 1625   MCHC 36.1 (H) 01/04/2019 1625   RDW 12.1 01/04/2019 1625   LYMPHSABS 2,218 01/04/2019 1625   MONOABS 0.5 08/15/2014 0935   EOSABS 554 (H) 01/04/2019 1625   BASOSABS 58 01/04/2019 1625    BMET    Component Value Date/Time   NA 138 07/13/2023 0756   K 4.4 07/13/2023 0756   CL 102 07/13/2023 0756   CO2 31 07/13/2023 0756   GLUCOSE 84 07/13/2023 0756   BUN 14 07/13/2023 0756   CREATININE 1.03 07/13/2023 0756   CALCIUM  9.2 07/13/2023 0756   GFRNONAA 74.80 05/06/2008 1646   GFRAA 91 01/02/2008 1020    BNP No results found for: BNP  ProBNP No results found for: PROBNP  Imaging: No results found.   Assessment & Plan:   1. Loud snoring (Primary) - Home sleep test; Future  2. Class 2 obesity due to excess calories with body mass index (BMI) of 36.0 to 36.9 in adult, unspecified whether serious comorbidity present  3. Moderate persistent asthma without complication   Assessment and Plan Assessment & Plan Suspected obstructive sleep  apnea with snoring Suspected obstructive sleep apnea with symptoms of snoring and altered sleep patterns. Recent weight loss noted. - Order sleep study through [facility] diagnostic - Advise him to send a message via Mychart two to three weeks after completing the sleep study to discuss results - Discuss potential treatment options based on sleep study results: continue weight loss efforts if mild, consider orthodontic surgery or CPAP if moderate or severe  Moderate persistent asthma Moderate persistent asthma with environmental triggers, particularly animal dander. Managed with Advair  as needed, approximately once a week. No recent exacerbations or need for albuterol  in the last month. - Continue current asthma management with Advair  100-50mcg one puff q12 hours as needed - Continue albuterol  2 puffs every 4-6 hours for emergency use  Obesity Obesity with ongoing weight management efforts. Currently on tirzepatide, compounded form, with no adverse effects reported. Engaging in regular physical activity. - Continue current dose of tirzepatide - Continue regular physical activity - Monitor weight management progress   Almarie LELON Ferrari, NP 12/18/2023

## 2023-12-30 ENCOUNTER — Other Ambulatory Visit: Payer: Self-pay | Admitting: Family Medicine

## 2024-01-17 ENCOUNTER — Other Ambulatory Visit: Payer: Self-pay | Admitting: Family Medicine

## 2024-02-09 ENCOUNTER — Telehealth: Payer: Self-pay | Admitting: Family Medicine

## 2024-02-09 ENCOUNTER — Other Ambulatory Visit: Payer: Self-pay | Admitting: Family Medicine

## 2024-02-09 DIAGNOSIS — E7800 Pure hypercholesterolemia, unspecified: Secondary | ICD-10-CM

## 2024-02-09 NOTE — Telephone Encounter (Signed)
 Copied from CRM #[id number]. Topic: Appointments - Scheduling Inquiry for Clinic >> Feb 09, 2024  1:14 PM Aaron Tyler wrote: Reason for CRM: Patient is wanting to schedule for labs per his provider. He said that he has lost a tremendous amount of weight and was told to get labs done in November or December. Please call patient.

## 2024-02-09 NOTE — Telephone Encounter (Signed)
 lvm for pt to call office to schedule appt.

## 2024-02-09 NOTE — Telephone Encounter (Signed)
 Please call patient and scheduled follow up appointment with Dr. Avelina with fasting labs prior:  Last AVS: Return in about 2 months (around 01/02/2024) for follow up mood, with fasting labs prior for CMET A1C and lipids.

## 2024-02-12 ENCOUNTER — Ambulatory Visit: Admitting: Primary Care

## 2024-02-12 ENCOUNTER — Encounter: Payer: Self-pay | Admitting: Primary Care

## 2024-02-12 VITALS — BP 132/78 | HR 81 | Temp 98.5°F | Wt 233.2 lb

## 2024-02-12 DIAGNOSIS — J4540 Moderate persistent asthma, uncomplicated: Secondary | ICD-10-CM | POA: Diagnosis not present

## 2024-02-12 MED ORDER — PREDNISONE 20 MG PO TABS: ORAL_TABLET | ORAL | 0 refills | Status: AC

## 2024-02-12 NOTE — Assessment & Plan Note (Signed)
 With what sounds like a mild acute exacerbation  Exam today overall reassuring.   Given history, coupled with lingering symptoms, will treat.  Start prednisone  20 mg tablets. Take 2 tablets by mouth once daily in the morning for 5 days. Continue Advair  inhaler. Continue albuterol  inhaler PRN.  Follow up PRN

## 2024-02-12 NOTE — Patient Instructions (Signed)
 Start prednisone  20 mg tablets. Take 2 tablets by mouth once daily in the morning for 5 days.  Continue Advair  inhaler twice daily.  Continue albuterol  inhaler as needed.  It was a pleasure to see you today!

## 2024-02-12 NOTE — Progress Notes (Signed)
 "  Subjective:    Patient ID: Aaron Tyler, male    DOB: 23-Mar-1975, 48 y.o.   MRN: [id number]  Aaron Tyler is a very pleasant 48 y.o. male with a history of moderate persistent asthma, allergic rhinitis who presents today to discuss acute cough.  He has a history of an allergy to a specific spore on an oak leaf. Nine days ago he was outdoors raking leaves. Once finished he came inside and vacuumed his home. Shortly thereafter he developed body joint aches, cough, fatigue, congestion, chest tightness. He's not felt himself since.   He tested negative for Covid-19 infection with a home test. He denies chest tightness, wheezing. He resumed his Advair  inhaler daily over the last 2 days and has noticed improvement today. He took benadryl, NSAID treatment last week.    He's been able to partake in his usual activities including regular exercise, but he doesn't feel like himself.     Review of Systems  Constitutional:  Positive for fatigue.  HENT:  Negative for ear pain and sore throat.   Respiratory:  Positive for cough. Negative for chest tightness and shortness of breath.   Allergic/Immunologic: Positive for environmental allergies.         Past Medical History:  Diagnosis Date   ADD (attention deficit disorder) without hyperactivity    History of tobacco abuse quit 2006   Hyperlipidemia    Pollen allergies    Shingles    Unspecified asthma(493.90)    Varicose veins LEG PAIN OVER CALF VARICOSITIES    HX OF SCLEROTHERAPY     Social History   Socioeconomic History   Marital status: Married    Spouse name: Not on file   Number of children: Not on file   Years of education: Not on file   Highest education level: Master's degree (e.g., MA, MS, MEng, MEd, MSW, MBA)  Occupational History   Occupation: brewing technologist and Camera Operator: [employer]  Tobacco Use   Smoking status: Former    Current packs/day: 0.00    Average packs/day: 0.3 packs/day    Types: Cigarettes     Quit date: 02/21/2001    Years since quitting: 22.9   Smokeless tobacco: Never   Tobacco comments:    pt. smoked in 2003 for about 3 months on a social basis  Vaping Use   Vaping status: Never Used  Substance and Sexual Activity   Alcohol use: Yes    Alcohol/week: 6.0 standard drinks of alcohol    Types: 6 Cans of beer per week    Comment: every other weekend   Drug use: No   Sexual activity: Not on file  Other Topics Concern   Not on file  Social History Narrative   Regular exercise---yes, not regularly but sometimes 1-2 times per week      Diet--- chick salad, organics, + h2o, some fast food, occasionally skips breakfast         Social Drivers of Health   Tobacco Use: Medium Risk (02/12/2024)   Patient History    Smoking Tobacco Use: Former    Smokeless Tobacco Use: Never    Passive Exposure: Not on file  Financial Resource Strain: Low Risk (11/02/2023)   Overall Financial Resource Strain (CARDIA)    Difficulty of Paying Living Expenses: Not hard at all  Food Insecurity: No Food Insecurity (11/02/2023)   Epic    Worried About Radiation Protection Practitioner of Food in the Last Year: Never true  Ran Out of Food in the Last Year: Never true  Transportation Needs: No Transportation Needs (11/02/2023)   Epic    Lack of Transportation (Medical): No    Lack of Transportation (Non-Medical): No  Physical Activity: Sufficiently Active (11/02/2023)   Exercise Vital Sign    Days of Exercise per Week: 5 days    Minutes of Exercise per Session: 100 min  Stress: Stress Concern Present (11/02/2023)   [employer] of Occupational Health - Occupational Stress Questionnaire    Feeling of Stress: Rather much  Social Connections: Socially Integrated (11/02/2023)   Social Connection and Isolation Panel    Frequency of Communication with Friends and Family: More than three times a week    Frequency of Social Gatherings with Friends and Family: Three times a week    Attends Religious Services: More than 4  times per year    Active Member of Clubs or Organizations: Yes    Attends Banker Meetings: More than 4 times per year    Marital Status: Married  Catering Manager Violence: Not on file  Depression (PHQ2-9): Low Risk (11/02/2023)   Depression (PHQ2-9)    PHQ-2 Score: 1  Alcohol Screen: Low Risk (11/02/2023)   Alcohol Screen    Last Alcohol Screening Score (AUDIT): 0  Housing: High Risk (11/02/2023)   Epic    Unable to Pay for Housing in the Last Year: Yes    Number of Times Moved in the Last Year: 0    Homeless in the Last Year: No  Utilities: Not on file  Health Literacy: Not on file    Past Surgical History:  Procedure Laterality Date   ENDOVENOUS ABLATION SAPHENOUS VEIN W/ LASER  06-16-2011   right greater saphenous vein and stab phlebectomy >20 right leg   ENDOVENOUS ABLATION SAPHENOUS VEIN W/ LASER  09-15-2011   left greater saphenous vein and stab phlebectomies>20 Incisions left leg   ESOPHAGOGASTRODUODENOSCOPY     ?achalasia   surgical removal of splinter (foreign body) right foot  06-2011   right foot   VARICOSE VEIN SURGERY     sclerotherapy    Family History  Problem Relation Age of Onset   Heart disease Mother    Heart attack Father 51   Hypertension Father    Hyperlipidemia Brother    Cancer Brother        throat   Hyperlipidemia Brother    Hyperlipidemia Brother    Cancer Maternal Grandfather        lung   Colon cancer Neg Hx    Rectal cancer Neg Hx    Stomach cancer Neg Hx     Allergies[1]  Medications Ordered Prior to Encounter[2]  BP 132/78 (BP Location: Left Arm, Patient Position: Sitting, Cuff Size: Large)   Pulse 81   Temp 98.5 F (36.9 C) (Temporal)   Wt 233 lb 3.2 oz (105.8 kg)   SpO2 97%   BMI 29.15 kg/m  Objective:   Physical Exam Constitutional:      General: He is not in acute distress. HENT:     Right Ear: Tympanic membrane and ear canal normal.     Left Ear: Tympanic membrane and ear canal normal.      Mouth/Throat:     Mouth: Mucous membranes are moist.  Cardiovascular:     Rate and Rhythm: Normal rate and regular rhythm.  Pulmonary:     Effort: Pulmonary effort is normal.     Breath sounds: Normal breath sounds. No wheezing.  Skin:    General: Skin is warm and dry.  Neurological:     Mental Status: He is alert.     Physical Exam        Assessment & Plan:  Moderate persistent asthma without complication Assessment & Plan: With what sounds like a mild acute exacerbation  Exam today overall reassuring.   Given history, coupled with lingering symptoms, will treat.  Start prednisone  20 mg tablets. Take 2 tablets by mouth once daily in the morning for 5 days. Continue Advair  inhaler. Continue albuterol  inhaler PRN.  Follow up PRN  Orders: -     predniSONE ; Take 2 tablets by mouth once daily in the morning for 5 days.  Dispense: 10 tablet; Refill: 0    Assessment and Plan Assessment & Plan         Comer MARLA Gaskins, NP       [1] No Known Allergies [2]  Current Outpatient Medications on File Prior to Visit  Medication Sig Dispense Refill   ADVAIR  DISKUS 100-50 MCG/ACT AEPB Inhale 1 puff into the lungs 2 (two) times daily. 60 each 5   albuterol  (VENTOLIN  HFA) 108 (90 Base) MCG/ACT inhaler INHALE 2 PUFFS INTO THE LUNGS EVERY 6 HOURS AS NEEDED FOR WHEEZING 8.5 g 2   diazepam  (VALIUM ) 5 MG tablet Take 0.5 tablets (2.5 mg total) by mouth at bedtime as needed for anxiety. 30 tablet 1   fluticasone  (FLONASE) 50 MCG/ACT nasal spray Place 1 spray into both nostrils daily as needed for allergies or rhinitis.     losartan  (COZAAR ) 25 MG tablet TAKE 1 TABLET(25 MG) BY MOUTH DAILY 90 tablet 1   meloxicam  (MOBIC ) 15 MG tablet Take 15 mg by mouth daily as needed for pain.     TESTOSTERONE  CYPIONATE IM Inject into the muscle once a week.     TIRZEPATIDE-WEIGHT MANAGEMENT [place] Inject into the skin once a week.     Current Facility-Administered Medications on File Prior to  Visit  Medication Dose Route Frequency Provider Last Rate Last Admin   0.9 %  sodium chloride  infusion  500 mL Intravenous Once McGreal, Inocente HERO, MD       "

## 2024-03-14 ENCOUNTER — Telehealth: Payer: Self-pay

## 2024-03-14 ENCOUNTER — Other Ambulatory Visit (HOSPITAL_COMMUNITY): Payer: Self-pay

## 2024-03-14 NOTE — Telephone Encounter (Signed)
 Pharmacy Patient Advocate Encounter   Received notification from [facility] that prior authorization for Advair  Diskus 100-50 is required/requested.   Insurance verification completed.   The patient is insured through [facility].   Per test claim:  Wixela Inhub 100-50 is preferred by the insurance.  If suggested medication is appropriate, Please send in a new RX and discontinue this one. If not, please advise as to why it's not appropriate so that we may request a Prior Authorization. Please note, some preferred medications may still require a PA.  If the suggested medications have not been trialed and there are no contraindications to their use, the PA will not be submitted, as it will not be approved. Archived Key: [id number]  Advair  Diskus has a plan benefit exclusion. Ran test claim for Wixela Inhub 100-50 and copay is $77.91 (patient has deductible)

## 2024-03-14 NOTE — Telephone Encounter (Signed)
 Left message for Aaron Tyler to return call to office in regards to his Advair .

## 2024-03-15 NOTE — Telephone Encounter (Addendum)
 Left message for Aaron Tyler to return call to office in regards to his Advair .   I have tried to reach patient via telephone and MyChart with no response.  Will close encounter for now and await patient to contact office.

## 2024-03-15 NOTE — Telephone Encounter (Signed)
 MyChart message also sent to patient in regards to his Advair  Inhaler.
